# Patient Record
Sex: Female | Born: 1960 | Hispanic: No | Marital: Married | Smoking: Former smoker
Health system: Southern US, Community
[De-identification: ages and names within clinical notes are randomized; demographics above are authoritative.]

## PROBLEM LIST (undated history)

## (undated) DIAGNOSIS — M1990 Unspecified osteoarthritis, unspecified site: Secondary | ICD-10-CM

## (undated) DIAGNOSIS — I499 Cardiac arrhythmia, unspecified: Secondary | ICD-10-CM

## (undated) DIAGNOSIS — I7300 Raynaud's syndrome without gangrene: Secondary | ICD-10-CM

## (undated) HISTORY — PX: CHOLECYSTECTOMY: SHX55

## (undated) HISTORY — PX: HERNIA REPAIR: SHX51

---

## 2002-12-08 ENCOUNTER — Other Ambulatory Visit: Admission: RE | Admit: 2002-12-08 | Discharge: 2002-12-08 | Payer: Self-pay | Admitting: Obstetrics and Gynecology

## 2013-12-10 ENCOUNTER — Other Ambulatory Visit (HOSPITAL_COMMUNITY): Payer: Self-pay | Admitting: Orthopaedic Surgery

## 2013-12-14 ENCOUNTER — Encounter (HOSPITAL_COMMUNITY): Payer: Self-pay

## 2013-12-14 ENCOUNTER — Encounter (HOSPITAL_COMMUNITY): Payer: Self-pay | Admitting: Pharmacy Technician

## 2013-12-14 ENCOUNTER — Encounter (HOSPITAL_COMMUNITY)
Admission: RE | Admit: 2013-12-14 | Discharge: 2013-12-14 | Disposition: A | Discharge status: Home / Self Care | Payer: BC Managed Care – PPO | Source: Ambulatory Visit | Attending: Orthopaedic Surgery | Admitting: Orthopaedic Surgery

## 2013-12-14 DIAGNOSIS — Z01818 Encounter for other preprocedural examination: Secondary | ICD-10-CM | POA: Insufficient documentation

## 2013-12-14 DIAGNOSIS — Z01812 Encounter for preprocedural laboratory examination: Secondary | ICD-10-CM | POA: Insufficient documentation

## 2013-12-14 HISTORY — DX: Unspecified osteoarthritis, unspecified site: M19.90

## 2013-12-14 HISTORY — DX: Raynaud's syndrome without gangrene: I73.00

## 2013-12-14 HISTORY — DX: Cardiac arrhythmia, unspecified: I49.9

## 2013-12-14 LAB — CBC
HEMATOCRIT: 39.6 % (ref 36.0–46.0)
Hemoglobin: 12.4 g/dL (ref 12.0–15.0)
MCH: 25.3 pg — AB (ref 26.0–34.0)
MCHC: 31.3 g/dL (ref 30.0–36.0)
MCV: 80.7 fL (ref 78.0–100.0)
Platelets: 227 10*3/uL (ref 150–400)
RBC: 4.91 MIL/uL (ref 3.87–5.11)
RDW: 15.3 % (ref 11.5–15.5)
WBC: 6.2 10*3/uL (ref 4.0–10.5)

## 2013-12-14 LAB — URINALYSIS, ROUTINE W REFLEX MICROSCOPIC
BILIRUBIN URINE: NEGATIVE
Glucose, UA: NEGATIVE mg/dL
Hgb urine dipstick: NEGATIVE
Ketones, ur: NEGATIVE mg/dL
Leukocytes, UA: NEGATIVE
Nitrite: NEGATIVE
PH: 6 (ref 5.0–8.0)
Protein, ur: NEGATIVE mg/dL
SPECIFIC GRAVITY, URINE: 1.004 — AB (ref 1.005–1.030)
Urobilinogen, UA: 0.2 mg/dL (ref 0.0–1.0)

## 2013-12-14 LAB — BASIC METABOLIC PANEL
Anion gap: 12 (ref 5–15)
BUN: 10 mg/dL (ref 6–23)
CALCIUM: 9.4 mg/dL (ref 8.4–10.5)
CO2: 25 meq/L (ref 19–32)
Chloride: 102 mEq/L (ref 96–112)
Creatinine, Ser: 0.6 mg/dL (ref 0.50–1.10)
GFR calc Af Amer: >90 mL/min (ref >90)
GFR calc non Af Amer: >90 mL/min (ref >90)
GLUCOSE: 92 mg/dL (ref 70–99)
Potassium: 4.3 mEq/L (ref 3.7–5.3)
SODIUM: 139 meq/L (ref 137–147)

## 2013-12-14 LAB — PROTIME-INR
INR: 0.93 (ref 0.00–1.49)
Prothrombin Time: 12.5 seconds (ref 11.6–15.2)

## 2013-12-14 LAB — APTT: APTT: 35 s (ref 24–37)

## 2013-12-14 LAB — SURGICAL PCR SCREEN
MRSA, PCR: NEGATIVE
Staphylococcus aureus: NEGATIVE

## 2013-12-14 LAB — HCG, SERUM, QUALITATIVE: PREG SERUM: NEGATIVE

## 2013-12-14 NOTE — Patient Instructions (Signed)
[name] [name]  12/14/2013   Your procedure is scheduled on:  12/24/2013  [id number]  Report to [facility].  Follow the Signs to Short Stay Center at    0530    am  Call this number if you have problems the morning of surgery: [phone]   Remember:   Do not eat food or drink liquids after midnight.   Take these medicines the morning of surgery with A SIP OF WATER:    Do not wear jewelry, make-up or nail polish.  Do not wear lotions, powders, or perfumes. , deodorant  Do not shave 48 hours prior to surgery.   Do not bring valuables to the hospital.  Contacts, dentures or bridgework may not be worn into surgery.  Leave suitcase in the car. After surgery it may be brought to your room.  For patients admitted to the hospital, checkout time is 11:00 AM the day of  discharge.   [facility] - Preparing for Surgery Before surgery, you can play an important role.  Because skin is not sterile, your skin needs to be as free of germs as possible.  You can reduce the number of germs on your skin by washing with CHG (chlorahexidine gluconate) soap before surgery.  CHG is an antiseptic cleaner which kills germs and bonds with the skin to continue killing germs even after washing. Please DO NOT use if you have an allergy to CHG or antibacterial soaps.  If your skin becomes reddened/irritated stop using the CHG and inform your nurse when you arrive at Short Stay. Do not shave (including legs and underarms) for at least 48 hours prior to the first CHG shower.  You may shave your face/neck. Please follow these instructions carefully:  1.  Shower with CHG Soap the night before surgery and the  morning of Surgery.  2.  If you choose to wash your hair, wash your hair first as usual with your  normal  shampoo.  3.  After you shampoo, rinse your hair and body thoroughly to remove the  shampoo.                           4.  Use CHG as you would any other liquid soap.  You can apply chg  directly  to the skin and wash                       Gently with a scrungie or clean washcloth.  5.  Apply the CHG Soap to your body ONLY FROM THE NECK DOWN.   Do not use on face/ open                           Wound or open sores. Avoid contact with eyes, ears mouth and genitals (private parts).                       Wash face,  Genitals (private parts) with your normal soap.             6.  Wash thoroughly, paying special attention to the area where your surgery  will be performed.  7.  Thoroughly rinse your body with warm water from the neck down.  8.  DO NOT shower/wash with your normal soap after using and rinsing off  the CHG Soap.  9.  Pat yourself dry with a clean towel.            10.  Wear clean pajamas.            11.  Place clean sheets on your bed the night of your first shower and do not  sleep with pets. Day of Surgery : Do not apply any lotions/deodorants the morning of surgery.  Please wear clean clothes to the hospital/surgery center.  FAILURE TO FOLLOW THESE INSTRUCTIONS MAY RESULT IN THE CANCELLATION OF YOUR SURGERY PATIENT SIGNATURE_________________________________  NURSE SIGNATURE__________________________________  ________________________________________________________________________  WHAT IS A BLOOD TRANSFUSION? Blood Transfusion Information  A transfusion is the replacement of blood or some of its parts. Blood is made up of multiple cells which provide different functions.  Red blood cells carry oxygen and are used for blood loss replacement.  White blood cells fight against infection.  Platelets control bleeding.  Plasma helps clot blood.  Other blood products are available for specialized needs, such as hemophilia or other clotting disorders. BEFORE THE TRANSFUSION  Who gives blood for transfusions?   Healthy volunteers who are fully evaluated to make sure their blood is safe. This is blood bank blood. Transfusion therapy is the safest  it has ever been in the practice of medicine. Before blood is taken from a donor, a complete history is taken to make sure that person has no history of diseases nor engages in risky social behavior (examples are intravenous drug use or sexual activity with multiple partners). The donor's travel history is screened to minimize risk of transmitting infections, such as malaria. The donated blood is tested for signs of infectious diseases, such as HIV and hepatitis. The blood is then tested to be sure it is compatible with you in order to minimize the chance of a transfusion reaction. If you or a relative donates blood, this is often done in anticipation of surgery and is not appropriate for emergency situations. It takes many days to process the donated blood. RISKS AND COMPLICATIONS Although transfusion therapy is very safe and saves many lives, the main dangers of transfusion include:   Getting an infectious disease.  Developing a transfusion reaction. This is an allergic reaction to something in the blood you were given. Every precaution is taken to prevent this. The decision to have a blood transfusion has been considered carefully by your caregiver before blood is given. Blood is not given unless the benefits outweigh the risks. AFTER THE TRANSFUSION  Right after receiving a blood transfusion, you will usually feel much better and more energetic. This is especially true if your red blood cells have gotten low (anemic). The transfusion raises the level of the red blood cells which carry oxygen, and this usually causes an energy increase.  The nurse administering the transfusion will monitor you carefully for complications. HOME CARE INSTRUCTIONS  No special instructions are needed after a transfusion. You may find your energy is better. Speak with your caregiver about any limitations on activity for underlying diseases you may have. SEEK MEDICAL CARE IF:   Your condition is not improving after  your transfusion.  You develop redness or irritation at the intravenous (IV) site. SEEK IMMEDIATE MEDICAL CARE IF:  Any of the following symptoms occur over the next 12 hours:  Shaking chills.  You have a temperature by mouth above 102 F (38.9 C), not controlled by medicine.  Chest, back, or muscle pain.  People around you feel you are not acting correctly or  are confused.  Shortness of breath or difficulty breathing.  Dizziness and fainting.  You get a rash or develop hives.  You have a decrease in urine output.  Your urine turns a dark color or changes to pink, red, or brown. Any of the following symptoms occur over the next 10 days:  You have a temperature by mouth above 102 F (38.9 C), not controlled by medicine.  Shortness of breath.  Weakness after normal activity.  The white part of the eye turns yellow (jaundice).  You have a decrease in the amount of urine or are urinating less often.  Your urine turns a dark color or changes to pink, red, or brown. Document Released: 05/04/2000 Document Revised: 07/30/2011 Document Reviewed: 12/22/2007 ExitCare Patient Information 2014 [place].  _______________________________________________________________________  Incentive Spirometer  An incentive spirometer is a tool that can help keep your lungs clear and active. This tool measures how well you are filling your lungs with each breath. Taking long deep breaths may help reverse or decrease the chance of developing breathing (pulmonary) problems (especially infection) following:  A long period of time when you are unable to move or be active. BEFORE THE PROCEDURE   If the spirometer includes an indicator to show your best effort, your nurse or respiratory therapist will set it to a desired goal.  If possible, sit up straight or lean slightly forward. Try not to slouch.  Hold the incentive spirometer in an upright position. INSTRUCTIONS FOR USE  1. Sit on  the edge of your bed if possible, or sit up as far as you can in bed or on a chair. 2. Hold the incentive spirometer in an upright position. 3. Breathe out normally. 4. Place the mouthpiece in your mouth and seal your lips tightly around it. 5. Breathe in slowly and as deeply as possible, raising the piston or the ball toward the top of the column. 6. Hold your breath for 3-5 seconds or for as long as possible. Allow the piston or ball to fall to the bottom of the column. 7. Remove the mouthpiece from your mouth and breathe out normally. 8. Rest for a few seconds and repeat Steps 1 through 7 at least 10 times every 1-2 hours when you are awake. Take your time and take a few normal breaths between deep breaths. 9. The spirometer may include an indicator to show your best effort. Use the indicator as a goal to work toward during each repetition. 10. After each set of 10 deep breaths, practice coughing to be sure your lungs are clear. If you have an incision (the cut made at the time of surgery), support your incision when coughing by placing a pillow or rolled up towels firmly against it. Once you are able to get out of bed, walk around indoors and cough well. You may stop using the incentive spirometer when instructed by your caregiver.  RISKS AND COMPLICATIONS  Take your time so you do not get dizzy or light-headed.  If you are in pain, you may need to take or ask for pain medication before doing incentive spirometry. It is harder to take a deep breath if you are having pain. AFTER USE  Rest and breathe slowly and easily.  It can be helpful to keep track of a log of your progress. Your caregiver can provide you with a simple table to help with this. If you are using the spirometer at home, follow these instructions: [place] IF:  You are having difficultly using the spirometer.  You have trouble using the spirometer as often as instructed.  Your pain medication is not giving  enough relief while using the spirometer.  You develop fever of 100.5 F (38.1 C) or higher. SEEK IMMEDIATE MEDICAL CARE IF:   You cough up bloody sputum that had not been present before.  You develop fever of 102 F (38.9 C) or greater.  You develop worsening pain at or near the incision site. MAKE SURE YOU:   Understand these instructions.  Will watch your condition.  Will get help right away if you are not doing well or get worse. Document Released: 09/17/2006 Document Revised: 07/30/2011 Document Reviewed: 11/18/2006 ExitCare Patient Information 2014 [employer], [place].   ________________________________________________________________________     Please read over the following fact sheets that you were given: MRSA Information, coughing and deep breathing exercises, leg exercises

## 2013-12-16 ENCOUNTER — Encounter (HOSPITAL_COMMUNITY): Payer: Self-pay

## 2013-12-24 ENCOUNTER — Inpatient Hospital Stay (HOSPITAL_COMMUNITY): Payer: BC Managed Care – PPO | Admitting: Certified Registered Nurse Anesthetist

## 2013-12-24 ENCOUNTER — Inpatient Hospital Stay (HOSPITAL_COMMUNITY): Payer: BC Managed Care – PPO

## 2013-12-24 ENCOUNTER — Inpatient Hospital Stay (HOSPITAL_COMMUNITY)
Admission: RE | Admit: 2013-12-24 | Discharge: 2013-12-27 | DRG: 470 | Disposition: A | Discharge status: Home Health | Payer: BC Managed Care – PPO | Source: Ambulatory Visit | Attending: Orthopaedic Surgery | Admitting: Orthopaedic Surgery

## 2013-12-24 ENCOUNTER — Encounter (HOSPITAL_COMMUNITY): Payer: BC Managed Care – PPO | Admitting: Certified Registered Nurse Anesthetist

## 2013-12-24 ENCOUNTER — Encounter (HOSPITAL_COMMUNITY): Payer: Self-pay | Admitting: *Deleted

## 2013-12-24 ENCOUNTER — Encounter (HOSPITAL_COMMUNITY)
Admission: RE | Disposition: A | Discharge status: Home Health | Payer: Self-pay | Source: Ambulatory Visit | Attending: Orthopaedic Surgery

## 2013-12-24 DIAGNOSIS — Z9089 Acquired absence of other organs: Secondary | ICD-10-CM

## 2013-12-24 DIAGNOSIS — M1611 Unilateral primary osteoarthritis, right hip: Secondary | ICD-10-CM

## 2013-12-24 DIAGNOSIS — Z01812 Encounter for preprocedural laboratory examination: Secondary | ICD-10-CM

## 2013-12-24 DIAGNOSIS — F17200 Nicotine dependence, unspecified, uncomplicated: Secondary | ICD-10-CM | POA: Diagnosis present

## 2013-12-24 DIAGNOSIS — Z6829 Body mass index (BMI) 29.0-29.9, adult: Secondary | ICD-10-CM

## 2013-12-24 DIAGNOSIS — Z96641 Presence of right artificial hip joint: Secondary | ICD-10-CM

## 2013-12-24 DIAGNOSIS — M1610 Unilateral primary osteoarthritis, unspecified hip: Principal | ICD-10-CM | POA: Diagnosis present

## 2013-12-24 DIAGNOSIS — I7300 Raynaud's syndrome without gangrene: Secondary | ICD-10-CM | POA: Diagnosis present

## 2013-12-24 DIAGNOSIS — M169 Osteoarthritis of hip, unspecified: Principal | ICD-10-CM | POA: Diagnosis present

## 2013-12-24 DIAGNOSIS — D62 Acute posthemorrhagic anemia: Secondary | ICD-10-CM | POA: Diagnosis not present

## 2013-12-24 HISTORY — PX: TOTAL HIP ARTHROPLASTY: SHX124

## 2013-12-24 LAB — TYPE AND SCREEN
ABO/RH(D): O POS
ANTIBODY SCREEN: NEGATIVE

## 2013-12-24 LAB — ABO/RH: ABO/RH(D): O POS

## 2013-12-24 SURGERY — ARTHROPLASTY, HIP, TOTAL, ANTERIOR APPROACH
Anesthesia: Spinal | Site: Hip | Laterality: Right

## 2013-12-24 MED ORDER — KETAMINE HCL 10 MG/ML IJ SOLN
INTRAMUSCULAR | Status: DC | PRN
  Administered 2013-12-24 (×3): 20 mg via INTRAVENOUS

## 2013-12-24 MED ORDER — ONDANSETRON HCL 4 MG PO TABS
4.0000 mg | ORAL_TABLET | Freq: Four times a day (QID) | ORAL | Status: DC | PRN
Start: 2013-12-24 — End: 2013-12-27

## 2013-12-24 MED ORDER — ONDANSETRON HCL 4 MG/2ML IJ SOLN: 4.0000 mg | Freq: Four times a day (QID) | INTRAMUSCULAR | Status: DC | PRN

## 2013-12-24 MED ORDER — DIPHENHYDRAMINE HCL 12.5 MG/5ML PO ELIX: 12.5000 mg | ORAL_SOLUTION | ORAL | Status: DC | PRN

## 2013-12-24 MED ORDER — PROPOFOL INFUSION 10 MG/ML OPTIME
INTRAVENOUS | Status: DC | PRN
  Administered 2013-12-24: 50 ug/kg/min via INTRAVENOUS

## 2013-12-24 MED ORDER — METHOCARBAMOL 1000 MG/10ML IJ SOLN
500.0000 mg | Freq: Four times a day (QID) | INTRAMUSCULAR | Status: DC | PRN
  Administered 2013-12-24: 500 mg via INTRAVENOUS
  Filled 2013-12-24: qty 5

## 2013-12-24 MED ORDER — SODIUM CHLORIDE 0.9 % IR SOLN
Status: DC | PRN
  Administered 2013-12-24: 1000 mL

## 2013-12-24 MED ORDER — ZOLPIDEM TARTRATE 5 MG PO TABS
5.0000 mg | ORAL_TABLET | Freq: Every evening | ORAL | Status: DC | PRN
Start: 2013-12-24 — End: 2013-12-27

## 2013-12-24 MED ORDER — ACETAMINOPHEN 10 MG/ML IV SOLN
1000.0000 mg | Freq: Once | INTRAVENOUS | Status: AC
  Administered 2013-12-24: 1000 mg via INTRAVENOUS
  Filled 2013-12-24: qty 100

## 2013-12-24 MED ORDER — SODIUM CHLORIDE 0.9 % IV SOLN
1000.0000 mg | INTRAVENOUS | Status: AC
  Administered 2013-12-24: 1000 mg via INTRAVENOUS
  Filled 2013-12-24: qty 10

## 2013-12-24 MED ORDER — FENTANYL CITRATE 0.05 MG/ML IJ SOLN
INTRAMUSCULAR | Status: DC | PRN
  Administered 2013-12-24 (×2): 50 ug via INTRAVENOUS

## 2013-12-24 MED ORDER — CEFAZOLIN SODIUM 1-5 GM-% IV SOLN
1.0000 g | Freq: Four times a day (QID) | INTRAVENOUS | Status: AC
  Administered 2013-12-24 (×2): 1 g via INTRAVENOUS
  Filled 2013-12-24 (×2): qty 50

## 2013-12-24 MED ORDER — ONDANSETRON HCL 4 MG/2ML IJ SOLN
INTRAMUSCULAR | Status: DC | PRN
  Administered 2013-12-24: 4 mg via INTRAVENOUS

## 2013-12-24 MED ORDER — METOCLOPRAMIDE HCL 5 MG/ML IJ SOLN: 5.0000 mg | Freq: Three times a day (TID) | INTRAMUSCULAR | Status: DC | PRN

## 2013-12-24 MED ORDER — CEFAZOLIN SODIUM-DEXTROSE 2-3 GM-% IV SOLR
2.0000 g | INTRAVENOUS | Status: AC
  Administered 2013-12-24: 2 g via INTRAVENOUS

## 2013-12-24 MED ORDER — DOCUSATE SODIUM 100 MG PO CAPS
100.0000 mg | ORAL_CAPSULE | Freq: Two times a day (BID) | ORAL | Status: DC
  Administered 2013-12-24 – 2013-12-27 (×6): 100 mg via ORAL

## 2013-12-24 MED ORDER — DEXAMETHASONE SODIUM PHOSPHATE 10 MG/ML IJ SOLN
INTRAMUSCULAR | Status: DC | PRN
  Administered 2013-12-24: 10 mg via INTRAVENOUS

## 2013-12-24 MED ORDER — 0.9 % SODIUM CHLORIDE (POUR BTL) OPTIME
TOPICAL | Status: DC | PRN
  Administered 2013-12-24: 1000 mL

## 2013-12-24 MED ORDER — MIDAZOLAM HCL 5 MG/5ML IJ SOLN
INTRAMUSCULAR | Status: DC | PRN
  Administered 2013-12-24: 2 mg via INTRAVENOUS

## 2013-12-24 MED ORDER — LACTATED RINGERS IV SOLN: INTRAVENOUS | Status: DC

## 2013-12-24 MED ORDER — ACETAMINOPHEN 325 MG PO TABS: 650.0000 mg | ORAL_TABLET | Freq: Four times a day (QID) | ORAL | Status: DC | PRN

## 2013-12-24 MED ORDER — ASPIRIN EC 325 MG PO TBEC
325.0000 mg | DELAYED_RELEASE_TABLET | Freq: Two times a day (BID) | ORAL | Status: DC
  Administered 2013-12-24 – 2013-12-27 (×6): 325 mg via ORAL
  Filled 2013-12-24 (×9): qty 1

## 2013-12-24 MED ORDER — OXYCODONE HCL 5 MG PO TABS
5.0000 mg | ORAL_TABLET | ORAL | Status: DC | PRN
  Administered 2013-12-24 (×2): 5 mg via ORAL
  Administered 2013-12-24 – 2013-12-27 (×19): 10 mg via ORAL
  Filled 2013-12-24 (×14): qty 2
  Filled 2013-12-24: qty 1
  Filled 2013-12-24 (×5): qty 2
  Filled 2013-12-24: qty 1

## 2013-12-24 MED ORDER — HYDROMORPHONE HCL PF 1 MG/ML IJ SOLN
0.2500 mg | INTRAMUSCULAR | Status: DC | PRN
Start: 2013-12-24 — End: 2013-12-24

## 2013-12-24 MED ORDER — ALUM & MAG HYDROXIDE-SIMETH 200-200-20 MG/5ML PO SUSP
30.0000 mL | ORAL | Status: DC | PRN
Start: 2013-12-24 — End: 2013-12-27

## 2013-12-24 MED ORDER — METHOCARBAMOL 500 MG PO TABS
500.0000 mg | ORAL_TABLET | Freq: Four times a day (QID) | ORAL | Status: DC | PRN
  Administered 2013-12-25 – 2013-12-27 (×8): 500 mg via ORAL
  Filled 2013-12-24 (×9): qty 1

## 2013-12-24 MED ORDER — METOCLOPRAMIDE HCL 10 MG PO TABS: 5.0000 mg | ORAL_TABLET | Freq: Three times a day (TID) | ORAL | Status: DC | PRN

## 2013-12-24 MED ORDER — MENTHOL 3 MG MT LOZG
1.0000 | LOZENGE | OROMUCOSAL | Status: DC | PRN
Start: 2013-12-24 — End: 2013-12-27
  Filled 2013-12-24: qty 9

## 2013-12-24 MED ORDER — PROPOFOL 10 MG/ML IV BOLUS
INTRAVENOUS | Status: DC | PRN
  Administered 2013-12-24 (×7): 20 mg via INTRAVENOUS

## 2013-12-24 MED ORDER — POLYETHYLENE GLYCOL 3350 17 G PO PACK
17.0000 g | PACK | Freq: Every day | ORAL | Status: DC | PRN
  Administered 2013-12-26 – 2013-12-27 (×2): 17 g via ORAL

## 2013-12-24 MED ORDER — PHENOL 1.4 % MT LIQD
1.0000 | OROMUCOSAL | Status: DC | PRN
Start: 2013-12-24 — End: 2013-12-27
  Filled 2013-12-24: qty 177

## 2013-12-24 MED ORDER — PROMETHAZINE HCL 25 MG/ML IJ SOLN: 6.2500 mg | INTRAMUSCULAR | Status: DC | PRN

## 2013-12-24 MED ORDER — SODIUM CHLORIDE 0.9 % IV SOLN
INTRAVENOUS | Status: DC
  Administered 2013-12-24: 12:00:00 via INTRAVENOUS
  Administered 2013-12-25: 75 mL/h via INTRAVENOUS
  Administered 2013-12-25: 08:00:00 via INTRAVENOUS

## 2013-12-24 MED ORDER — ACETAMINOPHEN 650 MG RE SUPP: 650.0000 mg | Freq: Four times a day (QID) | RECTAL | Status: DC | PRN

## 2013-12-24 MED ORDER — STERILE WATER FOR IRRIGATION IR SOLN
Status: DC | PRN
  Administered 2013-12-24: 1500 mL

## 2013-12-24 MED ORDER — LACTATED RINGERS IV SOLN
INTRAVENOUS | Status: DC | PRN
  Administered 2013-12-24 (×3): via INTRAVENOUS

## 2013-12-24 MED ORDER — HYDROMORPHONE HCL PF 1 MG/ML IJ SOLN
1.0000 mg | INTRAMUSCULAR | Status: DC | PRN
  Administered 2013-12-24 – 2013-12-25 (×3): 1 mg via INTRAVENOUS
  Filled 2013-12-24 (×4): qty 1

## 2013-12-24 SURGICAL SUPPLY — 48 items
BAG ZIPLOCK 12X15 (MISCELLANEOUS) ×3 IMPLANT
BENZOIN TINCTURE PRP APPL 2/3 (GAUZE/BANDAGES/DRESSINGS) ×3 IMPLANT
BLADE SAW SGTL 18X1.27X75 (BLADE) ×2 IMPLANT
BLADE SAW SGTL 18X1.27X75MM (BLADE) ×1
CAPT HIP PF COP ×3 IMPLANT
CELLS DAT CNTRL 66122 CELL SVR (MISCELLANEOUS) ×1 IMPLANT
CLOSURE WOUND 1/2 X4 (GAUZE/BANDAGES/DRESSINGS) ×1
COVER PERINEAL POST (MISCELLANEOUS) ×3 IMPLANT
DRAPE C-ARM 42X120 X-RAY (DRAPES) ×3 IMPLANT
DRAPE STERI IOBAN 125X83 (DRAPES) ×3 IMPLANT
DRAPE U-SHAPE 47X51 STRL (DRAPES) ×9 IMPLANT
DRSG AQUACEL AG ADV 3.5X10 (GAUZE/BANDAGES/DRESSINGS) ×3 IMPLANT
DURAPREP 26ML APPLICATOR (WOUND CARE) ×3 IMPLANT
ELECT BLADE TIP CTD 4 INCH (ELECTRODE) ×3 IMPLANT
ELECT REM PT RETURN 9FT ADLT (ELECTROSURGICAL) ×3
ELECTRODE REM PT RTRN 9FT ADLT (ELECTROSURGICAL) ×1 IMPLANT
FACESHIELD WRAPAROUND (MASK) ×12 IMPLANT
GAUZE XEROFORM 1X8 LF (GAUZE/BANDAGES/DRESSINGS) ×3 IMPLANT
GLOVE BIO SURGEON STRL SZ7.5 (GLOVE) ×3 IMPLANT
GLOVE BIOGEL PI IND STRL 6.5 (GLOVE) ×1 IMPLANT
GLOVE BIOGEL PI IND STRL 7.0 (GLOVE) ×1 IMPLANT
GLOVE BIOGEL PI IND STRL 7.5 (GLOVE) ×1 IMPLANT
GLOVE BIOGEL PI IND STRL 8 (GLOVE) ×2 IMPLANT
GLOVE BIOGEL PI INDICATOR 6.5 (GLOVE) ×2
GLOVE BIOGEL PI INDICATOR 7.0 (GLOVE) ×2
GLOVE BIOGEL PI INDICATOR 7.5 (GLOVE) ×2
GLOVE BIOGEL PI INDICATOR 8 (GLOVE) ×4
GLOVE ECLIPSE 8.0 STRL XLNG CF (GLOVE) ×3 IMPLANT
GLOVE SURG SS PI 7.0 STRL IVOR (GLOVE) ×3 IMPLANT
GLOVE SURG SS PI 7.5 STRL IVOR (GLOVE) ×3 IMPLANT
GOWN BRE IMP PREV XXLGXLNG (GOWN DISPOSABLE) ×3 IMPLANT
GOWN STRL REUS W/TWL XL LVL3 (GOWN DISPOSABLE) ×9 IMPLANT
HANDPIECE INTERPULSE COAX TIP (DISPOSABLE) ×2
HOLDER FOLEY CATH W/STRAP (MISCELLANEOUS) ×3 IMPLANT
KIT BASIN OR (CUSTOM PROCEDURE TRAY) ×3 IMPLANT
PACK TOTAL JOINT (CUSTOM PROCEDURE TRAY) ×3 IMPLANT
RTRCTR WOUND ALEXIS 18CM MED (MISCELLANEOUS) ×3
SET HNDPC FAN SPRY TIP SCT (DISPOSABLE) ×1 IMPLANT
STRIP CLOSURE SKIN 1/2X4 (GAUZE/BANDAGES/DRESSINGS) ×2 IMPLANT
SUT ETHIBOND NAB CT1 #1 30IN (SUTURE) ×3 IMPLANT
SUT MNCRL AB 4-0 PS2 18 (SUTURE) ×3 IMPLANT
SUT VIC AB 0 CT1 36 (SUTURE) ×3 IMPLANT
SUT VIC AB 1 CT1 36 (SUTURE) ×3 IMPLANT
SUT VIC AB 2-0 CT1 27 (SUTURE) ×4
SUT VIC AB 2-0 CT1 TAPERPNT 27 (SUTURE) ×2 IMPLANT
TOWEL OR 17X26 10 PK STRL BLUE (TOWEL DISPOSABLE) ×3 IMPLANT
TOWEL OR NON WOVEN STRL DISP B (DISPOSABLE) ×3 IMPLANT
TRAY FOLEY CATH 14FRSI W/METER (CATHETERS) ×3 IMPLANT

## 2013-12-24 NOTE — Evaluation (Signed)
Physical Therapy Evaluation Patient Details Name: [name] [name] MRN: [id number] DOB: 01-23-1961 Today's Date: 12/24/2013   History of Present Illness  R THR - ant dir  Clinical Impression  Pt s/p R THR presents with decreased R LE strength/ROM and post op pain limiting functional mobility.  Pt should progress well to d/c home with family assist and HHPT follow up.    Follow Up Recommendations Home health PT    Equipment Recommendations  Rolling walker with 5" wheels    Recommendations for Other Services OT consult     Precautions / Restrictions Precautions Precautions: Fall Restrictions Weight Bearing Restrictions: No Other Position/Activity Restrictions: WBAT      Mobility  Bed Mobility Overal bed mobility: Needs Assistance Bed Mobility: Supine to Sit     Supine to sit: Mod assist     General bed mobility comments: cues for sequence and use of L LE to self assist  Transfers Overall transfer level: Needs assistance Equipment used: Rolling walker (2 wheeled) Transfers: Sit to/from Stand Sit to Stand: Min assist         General transfer comment: cues for LE management and use of UEs to self assist  Ambulation/Gait Ambulation/Gait assistance: Min assist Ambulation Distance (Feet): 48 Feet Assistive device: Rolling walker (2 wheeled) Gait Pattern/deviations: Step-to pattern Gait velocity: decr   General Gait Details: cues for posture, sequence and position from [employer]            Wheelchair Mobility    Modified Rankin (Stroke Patients Only)       Balance                                             Pertinent Vitals/Pain Pain Assessment: 0-10 Pain Score: 6  Pain Location: R hip and thigh Pain Descriptors / Indicators: Aching;Burning Pain Intervention(s): Limited activity within patient's tolerance;Ice applied;Premedicated before session    Home Living Family/patient expects to be discharged to:: Private  residence Living Arrangements: Spouse/significant other Available Help at Discharge: Family Type of Home: House Home Access: Stairs to enter Entrance Stairs-Rails: None Entrance Stairs-Number of Steps: 4 Home Layout: One level Home Equipment: Walker - standard;Bedside commode Additional Comments: Pt plans to borrow SW    Prior Function Level of Independence: Independent               Hand Dominance   Dominant Hand: Right    Extremity/Trunk Assessment   Upper Extremity Assessment: Overall WFL for tasks assessed           Lower Extremity Assessment: RLE deficits/detail RLE Deficits / Details: Hip strength 2+/5 with AAROM at hip to 85 flex and 20 abd    Cervical / Trunk Assessment: Normal  Communication   Communication: No difficulties  Cognition Arousal/Alertness: Awake/alert Behavior During Therapy: WFL for tasks assessed/performed Overall Cognitive Status: Within Functional Limits for tasks assessed                      General Comments      Exercises Total Joint Exercises Ankle Circles/Pumps: AROM;Both;10 reps;Supine Quad Sets: AROM;Both;10 reps;Supine Heel Slides: AAROM;Right;15 reps;Supine Hip ABduction/ADduction: AAROM;Right;10 reps;Supine      Assessment/Plan    PT Assessment Patient needs continued PT services  PT Diagnosis Difficulty walking   PT Problem List Decreased strength;Decreased range of motion;Decreased activity tolerance;Decreased mobility;Decreased knowledge of use of DME;Pain  PT Treatment Interventions DME instruction;Gait training;Stair training;Functional mobility training;Therapeutic activities;Therapeutic exercise;Patient/family education   PT Goals (Current goals can be found in the Care Plan section) Acute Rehab PT Goals Patient Stated Goal: Resume previous lifestyle with decreased pain PT Goal Formulation: With patient Time For Goal Achievement: 12/31/13 Potential to Achieve Goals: Good    Frequency 7X/week    Barriers to discharge        Co-evaluation               End of Session Equipment Utilized During Treatment: Gait belt Activity Tolerance: Patient tolerated treatment well Patient left: in chair;with call bell/phone within reach;with family/visitor present Nurse Communication: Mobility status         Time: 1450-1518 PT Time Calculation (min): 28 min   Charges:   PT Evaluation $Initial PT Evaluation Tier I: 1 Procedure PT Treatments $Gait Training: 8-22 mins $Therapeutic Exercise: 8-22 mins   PT G Codes:          Oziah Vitanza 12/24/2013, 3:57 PM

## 2013-12-24 NOTE — Anesthesia Preprocedure Evaluation (Addendum)
Anesthesia Evaluation  Patient identified by MRN, date of birth, ID band Patient awake    Reviewed: Allergy & Precautions, H&P , NPO status , Patient's Chart, lab work & pertinent test results  Airway Mallampati: I TM Distance: >3 FB Neck ROM: Full    Dental  (+) Teeth Intact, Dental Advisory Given   Pulmonary Current Smoker,  breath sounds clear to auscultation        Cardiovascular + Peripheral Vascular Disease (Raynauds disease) Rhythm:Regular Rate:Normal     Neuro/Psych negative neurological ROS  negative psych ROS   GI/Hepatic negative GI ROS, Neg liver ROS,   Endo/Other  negative endocrine ROS  Renal/GU negative Renal ROS  negative genitourinary   Musculoskeletal negative musculoskeletal ROS (+)   Abdominal   Peds  Hematology negative hematology ROS (+)   Anesthesia Other Findings   Reproductive/Obstetrics negative OB ROS                          Anesthesia Physical Anesthesia Plan  ASA: II  Anesthesia Plan: Spinal   Post-op Pain Management:    Induction: Intravenous  Airway Management Planned: Simple Face Mask  Additional Equipment: None  Intra-op Plan:   Post-operative Plan:   Informed Consent: I have reviewed the patients History and Physical, chart, labs and discussed the procedure including the risks, benefits and alternatives for the proposed anesthesia with the patient or authorized representative who has indicated his/her understanding and acceptance.   Dental advisory given  Plan Discussed with: CRNA and Surgeon  Anesthesia Plan Comments:        Anesthesia Quick Evaluation

## 2013-12-24 NOTE — Anesthesia Postprocedure Evaluation (Signed)
  Anesthesia Post-op Note  Patient: [name] [name]  Procedure(s) Performed: Procedure(s): RIGHT TOTAL HIP ARTHROPLASTY ANTERIOR APPROACH (Right)  Patient Location: PACU  Anesthesia Type:Spinal  Level of Consciousness: awake, alert  and oriented  Airway and Oxygen Therapy: Patient Spontanous Breathing  Post-op Pain: none  Post-op Assessment: Post-op Vital signs reviewed. Spinal resolving and pt able to adduct both legs and activate hamstring muscles.   Post-op Vital Signs: Reviewed  Last Vitals:  Filed Vitals:   12/24/13 1015  BP: 114/39  Pulse: 63  Temp:   Resp: 13    Complications: No apparent anesthesia complications

## 2013-12-24 NOTE — Anesthesia Procedure Notes (Addendum)
Spinal  Patient location during procedure: OR Start time: 12/24/2013 7:30 AM Staffing Anesthesiologist: Marcene DuosFITZGERALD, Wilba Mutz E Performed by: anesthesiologist  Preanesthetic Checklist Completed: patient identified, site marked, surgical consent, pre-op evaluation, timeout performed, IV checked, risks and benefits discussed and monitors and equipment checked Spinal Block Patient position: sitting Prep: Betadine and site prepped and draped Patient monitoring: heart rate, cardiac monitor, continuous pulse ox and blood pressure Approach: midline Location: L2-3 Injection technique: single-shot Needle Needle type: Spinocan  Needle gauge: 22 G Needle length: 9 cm Additional Notes Prepped and draped in sterile fashion. CSF aspirated before and after injection 3cc's 0.5% Bupivicaine plain.

## 2013-12-24 NOTE — Brief Op Note (Signed)
12/24/2013  9:06 AM  PATIENT:  [name] [name]  53 y.o. female  PRE-OPERATIVE DIAGNOSIS:  Right hip osteoarthritis  POST-OPERATIVE DIAGNOSIS:  Right hip osteoarthritis  PROCEDURE:  Procedure(s): RIGHT TOTAL HIP ARTHROPLASTY ANTERIOR APPROACH (Right)  SURGEON:  Surgeon(s) and Role:    * Kathryne Hitchhristopher Y Essie Gehret, MD - Primary  PHYSICIAN ASSISTANT: Rexene EdisonGil Clark, PA-C  ANESTHESIA:   spinal  EBL:  Total I/O In: 1000 [I.V.:1000] Out: 600 [Urine:150; Blood:450]  BLOOD ADMINISTERED:none  DRAINS: none   LOCAL MEDICATIONS USED:  NONE  SPECIMEN:  No Specimen  DISPOSITION OF SPECIMEN:  N/A  COUNTS:  YES  TOURNIQUET:  * No tourniquets in log *  DICTATION: .Other Dictation: Dictation Number [id number]  PLAN OF CARE: Admit to inpatient   PATIENT DISPOSITION:  PACU - hemodynamically stable.   Delay start of Pharmacological VTE agent (>24hrs) due to surgical blood loss or risk of bleeding: no

## 2013-12-24 NOTE — H&P (Signed)
TOTAL HIP ADMISSION H&P  Patient is admitted for right total hip arthroplasty.  Subjective:  Chief Complaint: right hip pain  HPI: [name] [name], 53 y.o. female, has a history of pain and functional disability in the right hip(s) due to arthritis and patient has failed non-surgical conservative treatments for greater than 12 weeks to include NSAID's and/or analgesics, corticosteriod injections, supervised PT with diminished ADL's post treatment and activity modification.  Onset of symptoms was gradual starting 2 years ago with gradually worsening course since that time.The patient noted prior procedures of the hip to include arthroscopy on the right hip(s).  Patient currently rates pain in the right hip at 10 out of 10 with activity. Patient has worsening of pain with activity and weight bearing, pain that interfers with activities of daily living and pain with passive range of motion. Patient has evidence of periarticular osteophytes and joint space narrowing by imaging studies. This condition presents safety issues increasing the risk of falls.  There is no current active infection.  Patient Active Problem List   Diagnosis Date Noted  . Arthritis of right hip 12/24/2013   Past Medical History  Diagnosis Date  . Raynaud disease   . Arthritis   . Dysrhythmia     palpitations per patient     Past Surgical History  Procedure Laterality Date  . Cholecystectomy    . Hernia repair      right inguinal hernia repair     Prescriptions prior to admission  Medication Sig Dispense Refill  . naproxen sodium (ANAPROX) 220 MG tablet Take 440 mg by mouth 2 (two) times daily as needed (Pain).       No Known Allergies  History  Substance Use Topics  . Smoking status: Current Every Day Smoker -- 0.25 packs/day for 25 years  . Smokeless tobacco: Never Used  . Alcohol Use: Not on file     Comment: social     History reviewed. No pertinent family history.   Review of Systems   Musculoskeletal: Positive for joint pain.  All other systems reviewed and are negative.   Objective:  Physical Exam  Constitutional: She is oriented to person, place, and time. She appears well-developed and well-nourished.  HENT:  Head: Normocephalic and atraumatic.  Eyes: EOM are normal. Pupils are equal, round, and reactive to light.  Neck: Normal range of motion. Neck supple.  Cardiovascular: Normal rate and regular rhythm.   Respiratory: Effort normal and breath sounds normal.  GI: Soft. Bowel sounds are normal.  Musculoskeletal:       Right hip: She exhibits decreased range of motion, decreased strength and bony tenderness.  Neurological: She is alert and oriented to person, place, and time.  Skin: Skin is warm and dry.  Psychiatric: She has a normal mood and affect.    Vital signs in last 24 hours: Temp:  [98.2 F (36.8 C)] 98.2 F (36.8 C) (08/06 0537) Pulse Rate:  [87] 87 (08/06 0537) Resp:  [18] 18 (08/06 0537) BP: (125)/(80) 125/80 mmHg (08/06 0537) SpO2:  [100 %] 100 % (08/06 0537) Weight:  [75.297 kg (166 lb)] 75.297 kg (166 lb) (08/06 [id number])  Labs:   Estimated body mass index is 29.41 kg/(m^2) as calculated from the following:   Height as of this encounter: 5\' 3"  (1.6 m).   Weight as of this encounter: 75.297 kg (166 lb).   Imaging Review Plain radiographs demonstrate moderate degenerative joint disease of the right hip(s). The bone quality appears to be excellent  for age and reported activity level.  Assessment/Plan:  End stage arthritis, right hip(s)  The patient history, physical examination, clinical judgement of the provider and imaging studies are consistent with end stage degenerative joint disease of the right hip(s) and total hip arthroplasty is deemed medically necessary. The treatment options including medical management, injection therapy, arthroscopy and arthroplasty were discussed at length. The risks and benefits of total hip arthroplasty  were presented and reviewed. The risks due to aseptic loosening, infection, stiffness, dislocation/subluxation,  thromboembolic complications and other imponderables were discussed.  The patient acknowledged the explanation, agreed to proceed with the plan and consent was signed. Patient is being admitted for inpatient treatment for surgery, pain control, PT, OT, prophylactic antibiotics, VTE prophylaxis, progressive ambulation and ADL's and discharge planning.The patient is planning to be discharged home with home health services

## 2013-12-24 NOTE — Op Note (Signed)
NAMEllard Artis:  [name], [name]           ACCOUNT NO.:  [id number]  MEDICAL RECORD NO.:  [id number]  LOCATION:  [facility]                         [facility]  PHYSICIAN:  Vanita PandaChristopher Y. 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  PROCEDURE:  Right total hip arthroplasty through the direct anterior approach.  IMPLANTS:  DePuy Sector Gription acetabular component size 32, apex hole eliminator guide and a single screw, size 32+ 4 neutral polyethylene liner sock, size A Corail femoral component with standard offset, size 32+ 1 ceramic hip ball.  SURGEON:  Doneen Poissonhristopher Nikolos Billig, MD.  ASSISTING:  Richardean CanalGilbert Clark, PA-C.  ANESTHESIA:  Spinal.  ANTIBIOTICS:  2 g IV Ancef.  BLOOD LOSS:  Less than 500 mL.  COMPLICATIONS:  None.  INDICATIONS:  Ms. [name] [name] is a very pleasant 53 year old female with a 2- year history of worsening right hip pain.  Plain films do not show significant arthritic changes in her hip but an MRI did confirm degenerative labral tears of her hip, periarticular osteophytes, and sclerotic changes.  She has failed conservative treatment and did see an hip arthroscopy specialist who recommended total hip arthroplasty.  Her pain has been worsening and severe.  She has failed alternative methods of treatment and has gotten to where it is affecting her activities of daily living including walking, getting up out of bed, going to the bathroom.  All these are quite painful to her, and her mobility is limited.  Now, her quality of life is diminished.  She understands the risks of acute blood loss anemia, nerve and vessel injury, fracture, infection, dislocation, polyethylene wear as well  as DVT.  She understands the goals are decreased pain, improved mobility, and overall improved quality of life.  PROCEDURE DESCRIPTION:  After informed consent was obtained, appropriate right hip was marked.  She was brought to the operating room and spinal anesthesia was obtained while she was on her stretcher.  A Foley catheter was placed, and both feet had traction boots applied to them. Next, she was placed supine on the hana fracture table with perineal post in place and both legs in inline skeletal traction devices, but no traction applied.  Her right operative hip was then prepped and draped with DuraPrep and sterile drapes.  A time-out was called to identify the correct patient, correct right hip.  We then made an incision just inferior and posterior to the anterior superior iliac spine and carried this obliquely down the leg.  We dissected down to the tensor fascia lata muscle, and the tensor fascia was then divided longitudinally to ride access to the hip.  We then proceeded with a direct anterior approach.  We cauterized the lateral femoral circumflex vessels and then put Cobra retractors around the lateral neck and up underneath the rectus femoris on the medial neck.  I opened up the hip capsule in an L- type format and found a moderate effusion.  I placed the Cobra retractors within the hip capsule,  and then made my femoral neck cut proximal to the lesser trochanter using an oscillating saw and completed this with an osteotome.  I placed a corkscrew guide in the femoral head and removed the femoral head in its entirety and found just minimal cartilage changes that one could see visually.  There was degenerative labral tearing throughout.  I used a knife and forceps to remove the remnants of acetabular labrum.  We then began reaming.  We placed a bent Hohmann medially and a Cobra retractor laterally and then began reaming in 2 mm increments from a size 42 up to a size 50 with  all reamers under direct visualization, the last reamer also under direct fluoroscopy; so, we could obtain our depth of reaming under inclination and anteversion. Once we are pleased with this, we placed the real DePuy Sector Gription acetabular component size 50, an apex hole eliminator guide and a single screw.  Attention was then turned to the femur with the leg externally rotated to 100 degrees, extended and adducted.  We were able to place a Mueller retractor medially and Hohmann retractor behind the greater trochanter.  I released the lateral joint capsule, and used a box cutting osteotome to enter the femoral canal and a rongeur to lateralize.  I used a starter broach and then just a size 8 only broach. Using the Corail broaching system, we calcar planed off of this.  It was a nice tight fit.  So, we trialed a standard neck and a 32+ 1 trial hip ball.  We brought the leg back over and up with traction and internal rotation reducing the pelvis.  Her offset and leg lengths were measured equal, and she was stable past 90 degrees of external rotation and 40 degrees of internal rotation.  We then dislocated the hip and removed the trial components.  We replaced the real size 8 Corail femoral component and a real 32+ 1 ceramic hip ball reducing the back in the acetabulum; again, it was stable.  We copiously irrigated the soft tissues with normal saline solution.  We were able to close the joint capsule with interrupted #1 Ethibond suture followed by a running #1 Vicryl in the tensor fascia, 0 Vicryl the deep tissue, 2-0 Vicryl in subcutaneous tissue, 4-0 Monocryl subcuticular stitch, Steri-Strips, and Aquacel dressing.  She was taken off the hana table and was taken to the recovery room in stable condition.  All final counts were correct, and there were no complications noted.  Of note, Richardean Canal, PA-C, assisted during the entire case and his assistance was crucial for completing  this case, facilitated and completed the retracting and layered closure of the wound.     Vanita Panda. Magnus Ivan, M.D.     CYB/MEDQ  D:  12/24/2013  T:  12/24/2013  Job:  [id number]

## 2013-12-24 NOTE — Transfer of Care (Signed)
Immediate Anesthesia Transfer of Care Note  Patient: [name] BoardsChristine V [name]  Procedure(s) Performed: Procedure(s) (LRB): RIGHT TOTAL HIP ARTHROPLASTY ANTERIOR APPROACH (Right)  Patient Location: PACU  Anesthesia Type: Spinal  Level of Consciousness: sedated, patient cooperative and responds to stimulation  Airway & Oxygen Therapy: Patient Spontanous Breathing and Patient connected to face mask oxgen  Post-op Assessment: Report given to PACU RN and Post -op Vital signs reviewed and stable  Post vital signs: Reviewed and stable  Complications: No apparent anesthesia complications

## 2013-12-24 NOTE — Progress Notes (Signed)
CARE MANAGEMENT NOTE 12/24/2013  Patient:  [name] [name],[name] [name]   Account Number:  [id number]  Date Initiated:  12/24/2013  Documentation initiated by:  Lancelot Alyea  Subjective/Objective Assessment:   total right hip replacement     Action/Plan:   home with hhc   Anticipated DC Date:  12/27/2013   Anticipated DC Plan:  HOME W HOME HEALTH SERVICES  In-house referral  NA      DC Planning Services  CM consult      [facility]  [facility]   Choice offered to / List presented to:  C-1 Patient        HH arranged  HH-2 PT      Status of service:  In process, will continue to follow Medicare Important Message given?  NA - LOS <3 / Initial given by admissions (If response is "NO", the following Medicare IM given date fields will be blank) Date Medicare IM given:   Medicare IM given by:   Date Additional Medicare IM given:   Additional Medicare IM given by:    Discharge Disposition:    Per UR Regulation:  Reviewed for med. necessity/level of care/duration of stay  If discussed at Long Length of Stay Meetings, dates discussed:    Comments:  [id number]/[name] Dudek Stark JockDavis, RN, BSN, [place] [phone] Chart Reviewed for discharge and hospital needs. Discharge needs at time of review: None present will follow for needs. Review of patient progress due on [id number]

## 2013-12-25 LAB — CBC
HCT: 34.1 % — ABNORMAL LOW (ref 36.0–46.0)
Hemoglobin: 11 g/dL — ABNORMAL LOW (ref 12.0–15.0)
MCH: 25.8 pg — ABNORMAL LOW (ref 26.0–34.0)
MCHC: 32.3 g/dL (ref 30.0–36.0)
MCV: 80 fL (ref 78.0–100.0)
PLATELETS: 170 10*3/uL (ref 150–400)
RBC: 4.26 MIL/uL (ref 3.87–5.11)
RDW: 15.6 % — ABNORMAL HIGH (ref 11.5–15.5)
WBC: 10.9 10*3/uL — AB (ref 4.0–10.5)

## 2013-12-25 LAB — BASIC METABOLIC PANEL
ANION GAP: 12 (ref 5–15)
BUN: 6 mg/dL (ref 6–23)
CHLORIDE: 103 meq/L (ref 96–112)
CO2: 22 meq/L (ref 19–32)
Calcium: 8.6 mg/dL (ref 8.4–10.5)
Creatinine, Ser: 0.6 mg/dL (ref 0.50–1.10)
GFR calc Af Amer: >90 mL/min (ref >90)
GFR calc non Af Amer: >90 mL/min (ref >90)
Glucose, Bld: 117 mg/dL — ABNORMAL HIGH (ref 70–99)
Potassium: 3.7 mEq/L (ref 3.7–5.3)
Sodium: 137 mEq/L (ref 137–147)

## 2013-12-25 NOTE — Progress Notes (Signed)
Physical Therapy Treatment Patient Details Name: [name] BoardsChristine V [name] MRN: [id number] DOB: 05/28/1960 Today's Date: 12/25/2013    History of Present Illness R THR - ant dir    PT Comments      Follow Up Recommendations  Home health PT     Equipment Recommendations  Rolling walker with 5" wheels    Recommendations for Other Services OT consult     Precautions / Restrictions Precautions Precautions: Fall Restrictions Weight Bearing Restrictions: No Other Position/Activity Restrictions: WBAT    Mobility  Bed Mobility Overal bed mobility: Needs Assistance Bed Mobility: Supine to Sit;Sit to Supine     Supine to sit: Min assist Sit to supine: Min assist   General bed mobility comments: cues for sequence and use of L LE to self assist  Transfers Overall transfer level: Needs assistance Equipment used: Rolling walker (2 wheeled) Transfers: Sit to/from Stand Sit to Stand: Min guard         General transfer comment: cues for LE management and use of UEs to self assist  Ambulation/Gait Ambulation/Gait assistance: Min guard Ambulation Distance (Feet): 123 Feet Assistive device: Rolling walker (2 wheeled) Gait Pattern/deviations: Step-to pattern;Decreased step length - right;Decreased step length - left;Shuffle;Trunk flexed Gait velocity: decr   General Gait Details: cues for posture, sequence and position from [employer] [employer]   Stairs            Wheelchair Mobility    Modified Rankin (Stroke Patients Only)       Balance                                    Cognition Arousal/Alertness: Awake/alert Behavior During Therapy: WFL for tasks assessed/performed Overall Cognitive Status: Within Functional Limits for tasks assessed                      Exercises Total Joint Exercises Ankle Circles/Pumps: AROM;Both;10 reps;Supine Quad Sets: AROM;Both;10 reps;Supine Heel Slides: AAROM;Right;Supine;20 reps Hip ABduction/ADduction:  AAROM;Right;Supine;15 reps    General Comments        Pertinent Vitals/Pain Pain Assessment: 0-10 Pain Score: 5  Pain Location: R hip  Pain Descriptors / Indicators: Aching;Burning Pain Intervention(s): Ice applied;Limited activity within patient's tolerance;Premedicated before session    Home Living                      Prior Function            PT Goals (current goals can now be found in the care plan section) Acute Rehab PT Goals Patient Stated Goal: go to beach for Labor Day weekend PT Goal Formulation: With patient Time For Goal Achievement: 12/31/13 Potential to Achieve Goals: Good Progress towards PT goals: Progressing toward goals    Frequency  7X/week    PT Plan Current plan remains appropriate    Co-evaluation             End of Session Equipment Utilized During Treatment: Gait belt Activity Tolerance: Patient tolerated treatment well Patient left: in chair;with call bell/phone within reach;with family/visitor present     Time: 3664-40341354-1424 PT Time Calculation (min): 30 min  Charges:  $Gait Training: 8-22 mins $Therapeutic Exercise: 8-22 mins                    G Codes:      [name] [name] 12/25/2013, 3:12 PM

## 2013-12-25 NOTE — Progress Notes (Signed)
Subjective: 1 Day Post-Op Procedure(s) (LRB): RIGHT TOTAL HIP ARTHROPLASTY ANTERIOR APPROACH (Right) Patient reports pain as moderate to severe.  Up with therapy in hallway this AM.  Objective: Vital signs in last 24 hours: Temp:  [97.5 F (36.4 C)-98.7 F (37.1 C)] 98.7 F (37.1 C) (08/07 1002) Pulse Rate:  [65-85] 68 (08/07 1002) Resp:  [13-18] 16 (08/07 1002) BP: (101-132)/(55-78) 108/55 mmHg (08/07 1002) SpO2:  [98 %-100 %] 100 % (08/07 1002) Weight:  [72.3 kg (159 lb 6.3 oz)] 72.3 kg (159 lb 6.3 oz) (08/06 1053)  Intake/Output from previous day: 08/06 0701 - 08/07 0700 In: 6440.5 [P.O.:2880; I.V.:3560.5] Out: 5100 [Urine:4650; Blood:450] Intake/Output this shift: Total I/O In: 600 [P.O.:600] Out: 400 [Urine:400]   Recent Labs  12/25/13 0440  HGB 11.0*    Recent Labs  12/25/13 0440  WBC 10.9*  RBC 4.26  HCT 34.1*  PLT 170    Recent Labs  12/25/13 0440  NA 137  K 3.7  CL 103  CO2 22  BUN 6  CREATININE 0.60  GLUCOSE 117*  CALCIUM 8.6   No results found for this basename: LABPT, INR,  in the last 72 hours  Dorsiflexion/Plantar flexion intact Alert and oriented.  Assessment/Plan: 1 Day Post-Op Procedure(s) (LRB): RIGHT TOTAL HIP ARTHROPLASTY ANTERIOR APPROACH (Right) Up with therapy dispo home with home health in next 1-2 days  [name] [name] 12/25/2013, 10:15 AM

## 2013-12-25 NOTE — Progress Notes (Addendum)
Pt refused to allow d/c of foley at this time pt also having menses,Ivf rate decreased to 6330ml/hr.C/o feeling stiff this am,but denied needing assistance when offered to turn pt q 2hrs w/a. Linward HeadlandBeverly, Karri Kallenbach D

## 2013-12-25 NOTE — Evaluation (Signed)
Occupational Therapy Evaluation Patient Details Name: TONEY LIZAOLA MRN: [id number] DOB: 04-07-1961 Today's Date: 12/25/2013    History of Present Illness R THR - ant dir   Clinical Impression   This 53 year old female was admitted for the above surgery.  She was limited by pain and stiffness today. She will benefit from continued OT in acute to increase safety and independence with adls with overall supervision level goals except for bathing/dressing, for which she has assistance    Follow Up Recommendations  No OT follow up    Equipment Recommendations  None recommended by OT    Recommendations for Other Services       Precautions / Restrictions Precautions Precautions: Fall Restrictions Other Position/Activity Restrictions: WBAT      Mobility Bed Mobility         Supine to sit: Min assist     General bed mobility comments: assist for RLE  Transfers     Transfers: Sit to/from Stand Sit to Stand: Min assist         General transfer comment: cues for LE management and use of UEs to self assist    Balance                                            ADL Overall ADL's : Needs assistance/impaired     Grooming: Set up;Wash/dry hands;Sitting   Upper Body Bathing: Set up;Sitting   Lower Body Bathing: Moderate assistance;Sit to/from stand   Upper Body Dressing : Set up;Sitting   Lower Body Dressing: Maximal assistance;Sit to/from stand   Toilet Transfer: Minimal assistance;BSC;Ambulation   Toileting- Architect and Hygiene: Min guard;Sit to/from stand         General ADL Comments: ambulated to bathroom, performed hygiene and donned underwear.  Educated on shower transfer but pt was in too much pain to attempt.  Pt c/o high pain due to stiffness--improved a little at end of session  Mother and son will assist with adls at home.  She is not interested in AE     Vision                     Perception      Praxis      Pertinent Vitals/Pain Pain Assessment: 0-10 Pain Score: 8  Pain Location: R hip and thigh Pain Descriptors / Indicators:  (stiff) Pain Intervention(s): Repositioned;Patient requesting pain meds-RN notified     Hand Dominance Right   Extremity/Trunk Assessment Upper Extremity Assessment Upper Extremity Assessment: Overall WFL for tasks assessed           Communication Communication Communication: No difficulties   Cognition Arousal/Alertness: Awake/alert Behavior During Therapy: WFL for tasks assessed/performed Overall Cognitive Status: Within Functional Limits for tasks assessed                     General Comments       Exercises       Shoulder Instructions      Home Living Family/patient expects to be discharged to:: Private residence Living Arrangements: Spouse/significant other Available Help at Discharge: Family Type of Home: House Home Access: Stairs to enter Secretary/administrator of Steps: 4 Entrance Stairs-Rails: None Home Layout: One level     Bathroom Shower/Tub: Producer, television/film/video: Standard     Home Equipment: Shower seat;Bedside commode  Prior Functioning/Environment Level of Independence: Independent             OT Diagnosis: Generalized weakness;Acute pain   OT Problem List: Decreased strength;Decreased activity tolerance;Decreased knowledge of use of DME or AE;Pain   OT Treatment/Interventions: Self-care/ADL training;DME and/or AE instruction;Patient/family education    OT Goals(Current goals can be found in the care plan section) Acute Rehab OT Goals Patient Stated Goal: go to beach for Labor Day weekend OT Goal Formulation: With patient Time For Goal Achievement: 01/01/14 Potential to Achieve Goals: Good ADL Goals Pt Will Perform Grooming: with supervision;standing Pt Will Transfer to Toilet: with supervision;ambulating;bedside commode Pt Will Perform Toileting - Clothing  Manipulation and hygiene: with supervision;sit to/from stand Pt Will Perform Tub/Shower Transfer: with min guard assist;ambulating;shower seat;Shower transfer  OT Frequency: Min 2X/week   Barriers to D/C:            Co-evaluation              End of Session    Activity Tolerance: Patient limited by pain;Patient limited by fatigue (pt slept very poorly last night) Patient left: with call bell/phone within reach;with family/visitor present;in chair   Time: [id number] OT Time Calculation (min): 27 min Charges:  OT General Charges $OT Visit: 1 Procedure OT Evaluation $Initial OT Evaluation Tier I: 1 Procedure OT Treatments $Self Care/Home Management : 8-22 mins G-Codes:    Keatyn Jawad 12/25/2013, 10:18 AM   Marica OtterMaryellen Ervin Rothbauer, OTR/L [phone] 12/25/2013

## 2013-12-25 NOTE — Progress Notes (Signed)
Physical Therapy Treatment Patient Details Name: Barkley BoardsChristine V Hendriks MRN: [id number] DOB: 12/07/1960 Today's Date: 12/25/2013    History of Present Illness R THR - ant dir    PT Comments    Pt continues motivated but pain limited this am.  Follow Up Recommendations  Home health PT     Equipment Recommendations  Rolling walker with 5" wheels    Recommendations for Other Services OT consult     Precautions / Restrictions Precautions Precautions: Fall Restrictions Weight Bearing Restrictions: No Other Position/Activity Restrictions: WBAT    Mobility  Bed Mobility         Supine to sit: Min assist     General bed mobility comments: assist for RLE  Transfers Overall transfer level: Needs assistance Equipment used: Rolling walker (2 wheeled) Transfers: Sit to/from Stand Sit to Stand: Min assist         General transfer comment: cues for LE management and use of UEs to self assist  Ambulation/Gait Ambulation/Gait assistance: Min assist Ambulation Distance (Feet): 96 Feet Assistive device: Rolling walker (2 wheeled) Gait Pattern/deviations: Step-to pattern;Decreased step length - right;Decreased step length - left;Shuffle;Trunk flexed;Antalgic Gait velocity: decr   General Gait Details: cues for posture, sequence and position from [employer]   Stairs            Wheelchair Mobility    Modified Rankin (Stroke Patients Only)       Balance                                    Cognition Arousal/Alertness: Awake/alert Behavior During Therapy: WFL for tasks assessed/performed Overall Cognitive Status: Within Functional Limits for tasks assessed                      Exercises      General Comments        Pertinent Vitals/Pain Pain Assessment: 0-10 Pain Score: 6  Pain Location: R hip and ant thigh Pain Descriptors / Indicators: Aching;Burning Pain Intervention(s): Limited activity within patient's tolerance;Premedicated before  session;Ice applied    Home Living Family/patient expects to be discharged to:: Private residence Living Arrangements: Spouse/significant other Available Help at Discharge: Family Type of Home: House Home Access: Stairs to enter Entrance Stairs-Rails: None Home Layout: One level Home Equipment: Shower seat;Bedside commode      Prior Function Level of Independence: Independent          PT Goals (current goals can now be found in the care plan section) Acute Rehab PT Goals Patient Stated Goal: go to beach for Labor Day weekend PT Goal Formulation: With patient Time For Goal Achievement: 12/31/13 Potential to Achieve Goals: Good Progress towards PT goals: Progressing toward goals    Frequency  7X/week    PT Plan Current plan remains appropriate    Co-evaluation             End of Session   Activity Tolerance: Patient tolerated treatment well Patient left: in chair;with call bell/phone within reach;with family/visitor present     Time: 1010-1026 PT Time Calculation (min): 16 min  Charges:  $Gait Training: 8-22 mins                    G Codes:      Kyira Volkert 12/25/2013, 12:43 PM

## 2013-12-26 LAB — CBC
HEMATOCRIT: 29.8 % — AB (ref 36.0–46.0)
HEMOGLOBIN: 9.2 g/dL — AB (ref 12.0–15.0)
MCH: 25.4 pg — ABNORMAL LOW (ref 26.0–34.0)
MCHC: 30.9 g/dL (ref 30.0–36.0)
MCV: 82.3 fL (ref 78.0–100.0)
Platelets: 170 10*3/uL (ref 150–400)
RBC: 3.62 MIL/uL — ABNORMAL LOW (ref 3.87–5.11)
RDW: 15.6 % — AB (ref 11.5–15.5)
WBC: 7.3 10*3/uL (ref 4.0–10.5)

## 2013-12-26 MED ORDER — OXYCODONE-ACETAMINOPHEN 5-325 MG PO TABS: 1.0000 | ORAL_TABLET | ORAL | Status: DC | PRN

## 2013-12-26 MED ORDER — ASPIRIN 325 MG PO TBEC: 325.0000 mg | DELAYED_RELEASE_TABLET | Freq: Two times a day (BID) | ORAL | Status: DC

## 2013-12-26 MED ORDER — METHOCARBAMOL 500 MG PO TABS: 500.0000 mg | ORAL_TABLET | Freq: Three times a day (TID) | ORAL | Status: DC | PRN

## 2013-12-26 NOTE — Progress Notes (Signed)
Physical Therapy Treatment Patient Details Name: [name] BoardsChristine V [name] MRN: [id number] DOB: 04/14/1961 Today's Date: 12/26/2013    History of Present Illness R THR - ant dir    PT Comments    Progressing well.  Reviewed stairs and car transfers this date.  Follow Up Recommendations  Home health PT     Equipment Recommendations  Rolling walker with 5" wheels    Recommendations for Other Services OT consult     Precautions / Restrictions Precautions Precautions: Fall Restrictions Weight Bearing Restrictions: No Other Position/Activity Restrictions: WBAT    Mobility  Bed Mobility               General bed mobility comments: Pt declines to attempt - stating preference for chair  Transfers Overall transfer level: Needs assistance Equipment used: Rolling walker (2 wheeled) Transfers: Sit to/from Stand Sit to Stand: Supervision         General transfer comment: cues for LE position when sitting  Ambulation/Gait Ambulation/Gait assistance: Min guard;Supervision Ambulation Distance (Feet): 100 Feet Assistive device: Standard walker Gait Pattern/deviations: Step-to pattern;Decreased step length - right;Decreased step length - left;Shuffle;Trunk flexed Gait velocity: decr   General Gait Details: cues for posture, sequence and position from RW   Stairs Stairs: Yes Stairs assistance: Min assist Stair Management: No rails;Step to pattern;Backwards;With walker Number of Stairs: 4 General stair comments: cues for sequence and foot/SW placement  Wheelchair Mobility    Modified Rankin (Stroke Patients Only)       Balance                                    Cognition Arousal/Alertness: Awake/alert Behavior During Therapy: WFL for tasks assessed/performed Overall Cognitive Status: Within Functional Limits for tasks assessed                      Exercises      General Comments        Pertinent Vitals/Pain Pain Assessment:  0-10 Pain Score: 4  Pain Location: R hip Pain Descriptors / Indicators: Aching Pain Intervention(s): Limited activity within patient's tolerance;Patient requesting pain meds-RN notified;Ice applied    Home Living                      Prior Function            PT Goals (current goals can now be found in the care plan section) Acute Rehab PT Goals Patient Stated Goal: go to beach for Labor Day weekend PT Goal Formulation: With patient Time For Goal Achievement: 12/31/13 Potential to Achieve Goals: Good Progress towards PT goals: Progressing toward goals    Frequency  7X/week    PT Plan Current plan remains appropriate    Co-evaluation             End of Session Equipment Utilized During Treatment: Gait belt Activity Tolerance: Patient tolerated treatment well Patient left: in chair;with call bell/phone within reach;with family/visitor present     Time: 1350-1414 PT Time Calculation (min): 24 min  Charges:  $Gait Training: 8-22 mins $Therapeutic Activity: 8-22 mins                    G Codes:      Renalda Locklin 12/26/2013, 2:19 PM

## 2013-12-26 NOTE — Progress Notes (Signed)
Subjective: 2 Days Post-Op Procedure(s) (LRB): RIGHT TOTAL HIP ARTHROPLASTY ANTERIOR APPROACH (Right) Patient reports pain as moderate.  Better since yesterday.  Asymptomatic acute blood loss anemia.  Objective: Vital signs in last 24 hours: Temp:  [98.4 F (36.9 C)-98.8 F (37.1 C)] 98.8 F (37.1 C) (08/08 0539) Pulse Rate:  [68-95] 95 (08/08 0539) Resp:  [15-17] 17 (08/08 0539) BP: (100-109)/(45-55) 109/51 mmHg (08/08 0539) SpO2:  [94 %-100 %] 100 % (08/08 0539)  Intake/Output from previous day: 08/07 0701 - 08/08 0700 In: 2127.3 [P.O.:1320; I.V.:807.3] Out: 3650 [Urine:3650] Intake/Output this shift: Total I/O In: -  Out: 425 [Urine:425]   Recent Labs  12/25/13 0440 12/26/13 0545  HGB 11.0* 9.2*    Recent Labs  12/25/13 0440 12/26/13 0545  WBC 10.9* 7.3  RBC 4.26 3.62*  HCT 34.1* 29.8*  PLT 170 170    Recent Labs  12/25/13 0440  NA 137  K 3.7  CL 103  CO2 22  BUN 6  CREATININE 0.60  GLUCOSE 117*  CALCIUM 8.6   No results found for this basename: LABPT, INR,  in the last 72 hours  Sensation intact distally Intact pulses distally Dorsiflexion/Plantar flexion intact Incision: dressing C/D/I  Assessment/Plan: 2 Days Post-Op Procedure(s) (LRB): RIGHT TOTAL HIP ARTHROPLASTY ANTERIOR APPROACH (Right) Up with therapy Plan for discharge tomorrow  Kathryne HitchBLACKMAN,Veatrice Eckstein Y 12/26/2013, 9:21 AM

## 2013-12-26 NOTE — Progress Notes (Signed)
Physical Therapy Treatment Patient Details Name: [name] [name] MRN: [id number] DOB: 03/18/1961 Today's Date: 12/26/2013    History of Present Illness R THR - ant dir    PT Comments    Progressing well, will attempt stairs this pm  Follow Up Recommendations  Home health PT     Equipment Recommendations  Rolling walker with 5" wheels    Recommendations for Other Services OT consult     Precautions / Restrictions Precautions Precautions: Fall Restrictions Weight Bearing Restrictions: No Other Position/Activity Restrictions: WBAT    Mobility  Bed Mobility                  Transfers Overall transfer level: Needs assistance Equipment used: Rolling walker (2 wheeled) Transfers: Sit to/from Stand Sit to Stand: Supervision         General transfer comment: cues for LE position when sitting  Ambulation/Gait Ambulation/Gait assistance: Min guard;Supervision Ambulation Distance (Feet): 150 Feet Assistive device: Rolling walker (2 wheeled) Gait Pattern/deviations: Step-to pattern;Shuffle;Trunk flexed Gait velocity: decr   General Gait Details: cues for posture, sequence and position from [facility]   Stairs            Wheelchair Mobility    Modified Rankin (Stroke Patients Only)       Balance                                    Cognition Arousal/Alertness: Awake/alert Behavior During Therapy: WFL for tasks assessed/performed Overall Cognitive Status: Within Functional Limits for tasks assessed                      Exercises Total Joint Exercises Ankle Circles/Pumps: AROM;Both;10 reps;Supine Quad Sets: AROM;Both;10 reps;Supine Gluteal Sets: AROM;Both;10 reps;Supine Heel Slides: AAROM;Right;Supine;20 reps Hip ABduction/ADduction: AAROM;Right;Supine;15 reps Long Arc Quad: AROM;Right;Seated;15 reps    General Comments        Pertinent Vitals/Pain Pain Assessment: 0-10 Pain Score: 2  Pain Location: R hip Pain  Descriptors / Indicators: Aching Pain Intervention(s): Limited activity within patient's tolerance;Repositioned    Home Living                      Prior Function            PT Goals (current goals can now be found in the care plan section) Acute Rehab PT Goals Patient Stated Goal: [place] beach [place] weekend PT Goal Formulation: With patient Time For Goal Achievement: 12/31/13 Potential to Achieve Goals: Good Progress towards PT goals: Progressing toward goals    Frequency  7X/week    PT Plan Current plan remains appropriate    Co-evaluation             End of Session Equipment Utilized During Treatment: Gait belt Activity Tolerance: Patient tolerated treatment well Patient left: in chair;with call bell/phone within reach;with family/visitor present     Time: 0936-1006 PT Time Calculation (min): 30 min  Charges:  $Gait Training: 8-22 mins $Therapeutic Exercise: 8-22 mins                    G Codes:      Dezyrae Kensinger 12/26/2013, 12:36 PM

## 2013-12-26 NOTE — Progress Notes (Signed)
Occupational Therapy Treatment Patient Details Name: [name] [name] MRN: [id number] DOB: 01/13/1961 Today's Date: 12/26/2013    History of present illness R THR - ant dir   OT comments  Pt feeling much better today.  Goals met; no further OT needs  Follow Up Recommendations  No OT follow up    Equipment Recommendations  None recommended by OT    Recommendations for Other Services      Precautions / Restrictions Precautions Precautions: Fall Restrictions Weight Bearing Restrictions: No Other Position/Activity Restrictions: WBAT       Mobility Bed Mobility                  Transfers     Transfers: Sit to/from Stand Sit to Stand: Supervision         General transfer comment: cues for LE position when sitting    Balance                                   ADL Overall ADL's : Needs assistance/impaired     Grooming: Wash/dry hands;Set up;Standing                   Toilet Transfer: Supervision/safety;Ambulation;BSC   Toileting- Water quality scientist and Hygiene: Supervision/safety;Sit to/from stand   Tub/ Shower Transfer: Walk-in shower;Min guard;Ambulation     General ADL Comments: simulated shorter shower ledge and practiced backing in and coming out forward      Vision                     Perception     Praxis      Cognition   Behavior During Therapy: [facility] for tasks assessed/performed Overall Cognitive Status: Within Functional Limits for tasks assessed                       Extremity/Trunk Assessment               Exercises     Shoulder Instructions       General Comments      Pertinent Vitals/ Pain       Pain Assessment: 0-10 Pain Score: 2  Pain Location: R hip Pain Descriptors / Indicators: Aching Pain Intervention(s): Limited activity within patient's tolerance;Repositioned  Home Living                                          Prior  Functioning/Environment              Frequency       Progress Toward Goals  OT Goals(current goals can now be found in the care plan section)  Progress towards OT goals: Goals met/education completed, patient discharged from [place]      Co-evaluation                 End of Session     Activity Tolerance Patient tolerated treatment well   Patient Left in chair;with call bell/phone within reach;with family/visitor present   Nurse Communication          Time: 1517-6160 OT Time Calculation (min): 13 min  Charges: OT General Charges $OT Visit: 1 Procedure OT Treatments $Self Care/Home Management : 8-22 mins  Milany Geck 12/26/2013, 10:26 AM Lesle Chris, OTR/L [phone] 12/26/2013

## 2013-12-27 LAB — CBC
HCT: 29.6 % — ABNORMAL LOW (ref 36.0–46.0)
Hemoglobin: 9.3 g/dL — ABNORMAL LOW (ref 12.0–15.0)
MCH: 25.8 pg — ABNORMAL LOW (ref 26.0–34.0)
MCHC: 31.4 g/dL (ref 30.0–36.0)
MCV: 82 fL (ref 78.0–100.0)
Platelets: 174 10*3/uL (ref 150–400)
RBC: 3.61 MIL/uL — AB (ref 3.87–5.11)
RDW: 15.4 % (ref 11.5–15.5)
WBC: 6.8 10*3/uL (ref 4.0–10.5)

## 2013-12-27 NOTE — Discharge Summary (Signed)
Patient ID: Barkley BoardsChristine V Loy MRN: [id number] DOB/AGE: 51/12/1960 51 y.o.  Admit date: 12/24/2013 Discharge date: 12/27/2013  Admission Diagnoses:  Principal Problem:   Arthritis of right hip Active Problems:   Status post total replacement of right hip   Discharge Diagnoses:  Same  Past Medical History  Diagnosis Date  . Raynaud disease   . Arthritis   . Dysrhythmia     palpitations per patient     Surgeries: Procedure(s): RIGHT TOTAL HIP ARTHROPLASTY ANTERIOR APPROACH on 12/24/2013   Consultants:    Discharged Condition: Improved  Hospital Course: Barkley BoardsChristine V Costilla is an 53 y.o. female who was admitted 12/24/2013 for operative treatment ofArthritis of right hip. Patient has severe unremitting pain that affects sleep, daily activities, and work/hobbies. After pre-op clearance the patient was taken to the operating room on 12/24/2013 and underwent  Procedure(s): RIGHT TOTAL HIP ARTHROPLASTY ANTERIOR APPROACH.    Patient was given perioperative antibiotics: Anti-infectives   Start     Dose/Rate Route Frequency Ordered Stop   12/24/13 1400  ceFAZolin (ANCEF) IVPB 1 g/50 mL premix     1 g 100 mL/hr over 30 Minutes Intravenous Every 6 hours 12/24/13 1101 12/24/13 2027   12/24/13 0602  ceFAZolin (ANCEF) IVPB 2 g/50 mL premix     2 g 100 mL/hr over 30 Minutes Intravenous On call to O.R. 12/24/13 0602 12/24/13 0745       Patient was given sequential compression devices, early ambulation, and chemoprophylaxis to prevent DVT.  Patient benefited maximally from hospital stay and there were no complications.    Recent vital signs: Patient Vitals for the past 24 hrs:  BP Temp Temp src Pulse Resp SpO2  12/27/13 0437 109/54 mmHg 98.5 F (36.9 C) Oral 83 16 99 %  12/26/13 2100 104/53 mmHg 99.3 F (37.4 C) Oral 93 18 100 %  12/26/13 1508 108/51 mmHg 99.4 F (37.4 C) Oral 94 16 98 %     Recent laboratory studies:  Recent Labs  12/25/13 0440 12/26/13 0545 12/27/13 0423  WBC  10.9* 7.3 6.8  HGB 11.0* 9.2* 9.3*  HCT 34.1* 29.8* 29.6*  PLT 170 170 174  NA 137  --   --   K 3.7  --   --   CL 103  --   --   CO2 22  --   --   BUN 6  --   --   CREATININE 0.60  --   --   GLUCOSE 117*  --   --   CALCIUM 8.6  --   --      Discharge Medications:     Medication List    STOP taking these medications       naproxen sodium 220 MG tablet  Commonly known as:  ANAPROX      TAKE these medications       aspirin 325 MG EC tablet  Take 1 tablet (325 mg total) by mouth 2 (two) times daily after a meal.     methocarbamol 500 MG tablet  Commonly known as:  ROBAXIN  Take 1 tablet (500 mg total) by mouth every 8 (eight) hours as needed for muscle spasms.     oxyCODONE-acetaminophen 5-325 MG per tablet  Commonly known as:  ROXICET  Take 1-2 tablets by mouth every 4 (four) hours as needed for severe pain.        Diagnostic Studies: Dg Hip Complete Right  12/24/2013   CLINICAL DATA:  Preop for right total hip replacement  EXAM: RIGHT HIP - COMPLETE 2+ VIEW  COMPARISON:  None  FINDINGS: C-arm fluoroscopy was provided during right hip replacement. On the last films the femoral and acetabular components of the right hip replacement appear to be in good position and alignment. No complicating features are seen.  IMPRESSION: Right total hip replacement.  No complicating features.   Electronically Signed   By: Dwyane Dee M.D.   On: 12/24/2013 09:32   Dg Pelvis Portable  12/24/2013   CLINICAL DATA:  Postop right hip arthroplasty  EXAM: PORTABLE PELVIS 1-2 VIEWS  COMPARISON:  None.  FINDINGS: Right hip prosthetic components are well-seated and aligned on this single view. There is no acute fracture or evidence of an operative complication.  IMPRESSION: Well-aligned right hip prosthesis.   Electronically Signed   By: Amie Portland M.D.   On: 12/24/2013 10:32   Dg Hip Portable 1 View Right  12/24/2013   CLINICAL DATA:  Postop evaluation.  EXAM: PORTABLE RIGHT HIP - 1 VIEW   COMPARISON:  12/24/2013  FINDINGS: Single view appears to represent a cross-table lateral view. The right total hip arthroplasty is located. No evidence for a periprosthetic fracture.  IMPRESSION: Right hip arthroplasty without complicating features.   Electronically Signed   By: Richarda Overlie M.D.   On: 12/24/2013 10:33   Dg C-arm 1-60 Min-no Report  12/24/2013   CLINICAL DATA: right hip   C-ARM 1-60 MINUTES  Fluoroscopy was utilized by the requesting physician.  No radiographic  interpretation.     Disposition: to home      Discharge Instructions   Call MD / Call 911    Complete by:  As directed   If you experience chest pain or shortness of breath, CALL 911 and be transported to the hospital emergency room.  If you develope a fever above 101 F, pus (white drainage) or increased drainage or redness at the wound, or calf pain, call your surgeon's office.     Constipation Prevention    Complete by:  As directed   Drink plenty of fluids.  Prune juice may be helpful.  You may use a stool softener, such as Colace (over the counter) 100 mg twice a day.  Use MiraLax (over the counter) for constipation as needed.     Diet - low sodium heart healthy    Complete by:  As directed      Discharge instructions    Complete by:  As directed   Increase activities as comfort allows. You can get your current dressing wet in the shower. You can remove your current dressing at one week post-op and start getting your actual incsion wet. Try to leave the steristrips in place; new dry dressing daily after you remove the original dressing. Do take an over-the-counter stool softener twice daily.     Discharge patient    Complete by:  As directed      Increase activity slowly as tolerated    Complete by:  As directed      Weight bearing as tolerated    Complete by:  As directed            Follow-up Information   Follow up with Kathryne Hitch, MD In 2 weeks.   Specialty:  Orthopedic Surgery    Contact information:   [address] [place] [address] [phone]        Signed: Kathryne Hitch 12/27/2013, 9:08 AM

## 2013-12-27 NOTE — Discharge Instructions (Signed)
Pickup stool softener for constipation. Weight Bearing as tolerated  No hip precautions Progress activities slowly Expect hip and possible knee soreness and swelling. Apply heat or ice as needed. Keep dressing clean dry and intact, may shower with dressing intact. On Thursday remove dressing, incision can get wet. After showering apply clean dressing.

## 2013-12-27 NOTE — Progress Notes (Signed)
Subjective: 3 Days Post-Op Procedure(s) (LRB): RIGHT TOTAL HIP ARTHROPLASTY ANTERIOR APPROACH (Right) Patient reports pain as moderate.  H/H stable.  Asymptomatic.  Doing well with therapy.  Objective: Vital signs in last 24 hours: Temp:  [98.5 F (36.9 C)-99.4 F (37.4 C)] 98.5 F (36.9 C) (08/09 0437) Pulse Rate:  [83-94] 83 (08/09 0437) Resp:  [16-18] 16 (08/09 0437) BP: (104-109)/(51-54) 109/54 mmHg (08/09 0437) SpO2:  [98 %-100 %] 99 % (08/09 0437)  Intake/Output from previous day: 08/08 0701 - 08/09 0700 In: 1320 [P.O.:1320] Out: 725 [Urine:725] Intake/Output this shift:     Recent Labs  12/25/13 0440 12/26/13 0545 12/27/13 0423  HGB 11.0* 9.2* 9.3*    Recent Labs  12/26/13 0545 12/27/13 0423  WBC 7.3 6.8  RBC 3.62* 3.61*  HCT 29.8* 29.6*  PLT 170 174    Recent Labs  12/25/13 0440  NA 137  K 3.7  CL 103  CO2 22  BUN 6  CREATININE 0.60  GLUCOSE 117*  CALCIUM 8.6   No results found for this basename: LABPT, INR,  in the last 72 hours  Sensation intact distally Intact pulses distally Dorsiflexion/Plantar flexion intact Incision: scant drainage  Assessment/Plan: 3 Days Post-Op Procedure(s) (LRB): RIGHT TOTAL HIP ARTHROPLASTY ANTERIOR APPROACH (Right) Discharge home with home health today.  Linley Moxley Y 12/27/2013, 9:05 AM

## 2015-12-06 IMAGING — DX DG PORTABLE PELVIS
1 series · 1 of 1 positions shown · non-contrast
Comparison: None.

CLINICAL DATA: Postop right hip arthroplasty

EXAM:
PORTABLE PELVIS 1-2 VIEWS

[pelvis ap]
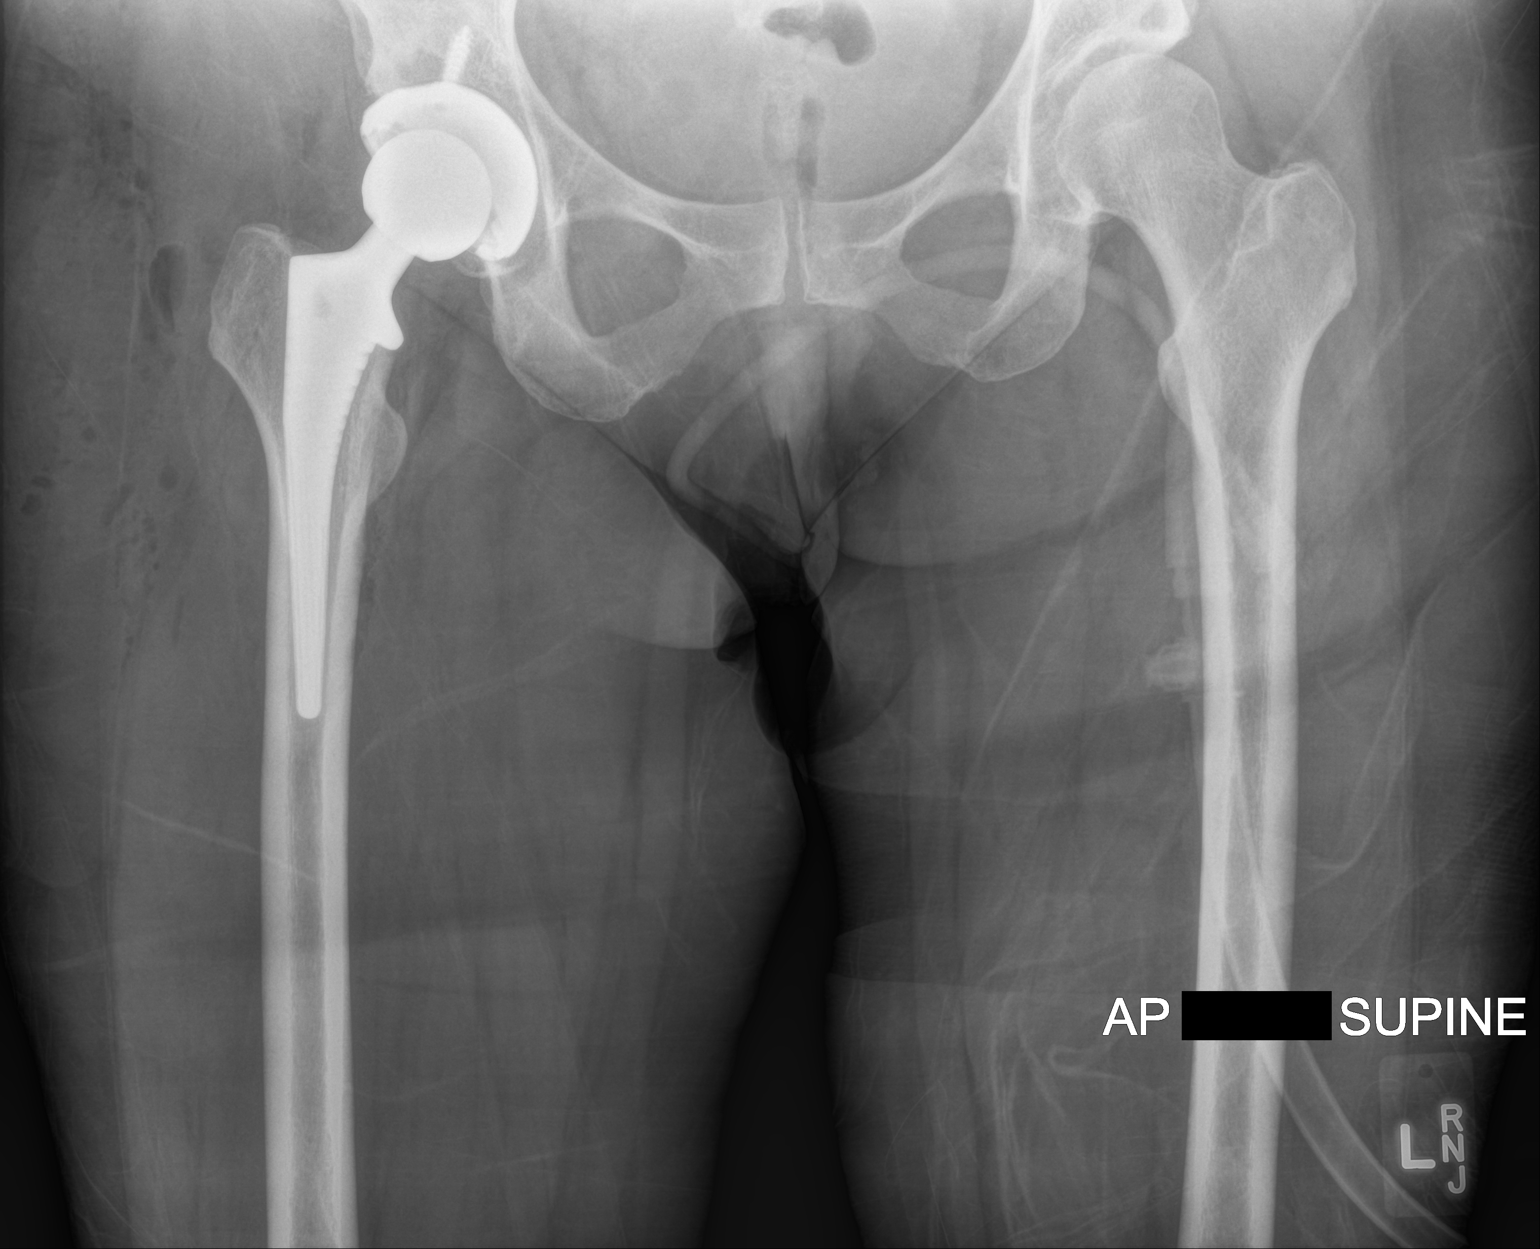

[1 of 1 positions shown; findings below may reference images not displayed]

FINDINGS: Right hip prosthetic components are well-seated and aligned on this
single view. There is no acute fracture or evidence of an operative
complication.
IMPRESSION: Well-aligned right hip prosthesis.

## 2018-04-30 ENCOUNTER — Other Ambulatory Visit: Payer: Self-pay | Admitting: Otolaryngology

## 2018-04-30 DIAGNOSIS — H9312 Tinnitus, left ear: Secondary | ICD-10-CM

## 2018-05-19 ENCOUNTER — Other Ambulatory Visit: Payer: BC Managed Care – PPO

## 2021-06-12 ENCOUNTER — Other Ambulatory Visit: Payer: Self-pay

## 2021-06-12 ENCOUNTER — Encounter: Payer: Self-pay | Admitting: Allergy and Immunology

## 2021-06-12 ENCOUNTER — Ambulatory Visit: Payer: BC Managed Care – PPO | Admitting: Allergy and Immunology

## 2021-06-12 VITALS — BP 122/82 | HR 63 | Resp 16 | Ht 63.0 in | Wt 162.8 lb

## 2021-06-12 DIAGNOSIS — K219 Gastro-esophageal reflux disease without esophagitis: Secondary | ICD-10-CM

## 2021-06-12 DIAGNOSIS — J4550 Severe persistent asthma, uncomplicated: Secondary | ICD-10-CM

## 2021-06-12 DIAGNOSIS — R7989 Other specified abnormal findings of blood chemistry: Secondary | ICD-10-CM

## 2021-06-12 MED ORDER — BREZTRI AEROSPHERE 160-9-4.8 MCG/ACT IN AERO: INHALATION_SPRAY | RESPIRATORY_TRACT | 5 refills | Status: AC

## 2021-06-12 MED ORDER — ALBUTEROL SULFATE HFA 108 (90 BASE) MCG/ACT IN AERS: INHALATION_SPRAY | RESPIRATORY_TRACT | 1 refills | Status: AC

## 2021-06-12 MED ORDER — OMEPRAZOLE 40 MG PO CPDR: DELAYED_RELEASE_CAPSULE | ORAL | 5 refills | Status: DC

## 2021-06-12 MED ORDER — MONTELUKAST SODIUM 10 MG PO TABS: 10.0000 mg | ORAL_TABLET | Freq: Every day | ORAL | 5 refills | Status: AC

## 2021-06-12 NOTE — Progress Notes (Signed)
[place] [facility] [place] - [place] - [place] - [place]   NEW PATIENT NOTE  Referring Provider: No ref. provider found Primary Provider: Marylen Ponto, MD Date of office visit: 06/12/2021    Subjective:   Chief Complaint:  [name] [name] (DOB: [birth year]) is a 61 y.o. female who presents to the clinic on 06/12/2021 with a chief complaint of Wheezing, Shortness of Breath, and Cough .     HPI: [name] [name] presents to this clinic in evaluation of asthma.  In September or October 2021 she developed acute onset of wheezing and coughing and shortness of breath and she had to use her son's albuterol and she did quite well up until September 2022 at which point in time she once again developed wheezing and coughing and shortness of breath that has been a persistent issue ever since that event.  It sounds as though she has been to the urgent care center on 2 occasions and has received systemic steroids with each evaluation since September 2022.  Systemic steroids do appear to help her for a few weeks.  Currently, she wheezes and she coughs and she must use her bronchodilator 4 times per day and she has nocturnal bronchospastic symptoms.  She also has lots of throat clearing and mucus in her throat.  She does have heartburn with spicy foods and she uses Tums at least 1 time per week.  She apparently had a chest x-ray 1 month ago at urgent care and this was "okay".  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  She has minimal nasal symptoms.  She contracted COVID in November 2022 with a flulike illness with fever and sore throat and fortunately resolved that issue without any antiviral agents.  She has been immunized for COVID on 3 occasions and has received this year's flu vaccine.  Past Medical History:  Diagnosis Date   Arthritis    Dysrhythmia    palpitations per patient    Raynaud disease     Past Surgical History:  Procedure Laterality Date    CHOLECYSTECTOMY     HERNIA REPAIR     right inguinal hernia repair    TOTAL HIP ARTHROPLASTY Right 12/24/2013   Procedure: RIGHT TOTAL HIP ARTHROPLASTY ANTERIOR APPROACH;  Surgeon: Kathryne Hitch, MD;  Location: [facility];  Service: Orthopedics;  Laterality: Right;    Allergies as of 06/12/2021   No Known Allergies      Medication List    albuterol 108 (90 Base) MCG/ACT inhaler Commonly known as: VENTOLIN HFA Inhale into the lungs.   [employer]onAir Digihaler 232 MCG/ACT Aepb Generic drug: Fluticasone Propionate(sensor) Inhale 1 puff into the lungs 2 (two) times daily.   valsartan-hydrochlorothiazide 80-12.5 MG tablet Commonly known as: DIOVAN-HCT Take 1 tablet by mouth daily.    Review of systems negative except as noted in HPI / PMHx or noted below:  Review of Systems  Constitutional: Negative.   HENT: Negative.    Eyes: Negative.   Respiratory: Negative.    Cardiovascular: Negative.   Gastrointestinal: Negative.   Genitourinary: Negative.   Musculoskeletal: Negative.   Skin: Negative.   Neurological: Negative.   Endo/Heme/Allergies: Negative.   Psychiatric/Behavioral: Negative.     Family History  Problem Relation Age of Onset   Allergic rhinitis Father    Asthma Sister    Asthma Maternal Aunt     Social History   Socioeconomic History   Marital status: Married    Spouse name: Not  on file   Number of children: Not on file   Years of education: Not on file   Highest education level: Not on file  Occupational History   Not on file  Tobacco Use   Smoking status: Former    Packs/day: 0.25    Years: 25.00    Pack years: 6.25    Types: Cigarettes   Smokeless tobacco: Never  Substance and Sexual Activity   Alcohol use: Not on file    Comment: social    Drug use: No   Sexual activity: Not on file  Other Topics Concern   Not on file  Social History Narrative   Not on file   Environmental and Social history  Lives in a house with a dry environment,  a cat introduced inside the household and September or October 2021, carpet in the bedroom, no plastic on the bed, no plastic on the pillow, and no smoking ongoing with inside the household.  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  Objective:   Vitals:   06/12/21 1417  BP: 122/82  Pulse: 63  Resp: 16  SpO2: 98%   Height: 5\' 3"  (160 cm) Weight: 162 lb 12.8 oz (73.8 kg)  Physical Exam Constitutional:      Appearance: She is not diaphoretic.  HENT:     Head: Normocephalic.     Right Ear: Tympanic membrane, ear canal and external ear normal.     Left Ear: Tympanic membrane, ear canal and external ear normal.     Nose: Nose normal. No mucosal edema or rhinorrhea.     Mouth/Throat:     Pharynx: Uvula midline. No oropharyngeal exudate.  Eyes:     Conjunctiva/sclera: Conjunctivae normal.  Neck:     Thyroid: No thyromegaly.     Trachea: Trachea normal. No tracheal tenderness or tracheal deviation.  Cardiovascular:     Rate and Rhythm: Normal rate and regular rhythm.     Heart sounds: Normal heart sounds, S1 normal and S2 normal. No murmur heard. Pulmonary:     Effort: No respiratory distress.     Breath sounds: No stridor. Wheezing (Bilateral inspiratory and expiratory wheezes all lung fields) present. No rales.  Lymphadenopathy:     Head:     Right side of head: No tonsillar adenopathy.     Left side of head: No tonsillar adenopathy.     Cervical: No cervical adenopathy.  Skin:    Findings: No erythema or rash.     Nails: There is no clubbing.  Neurological:     Mental Status: She is alert.    Diagnostics: Allergy skin tests were performed.  She did not demonstrate any hypersensitivity against a screening panel of aeroallergens or foods.  Spirometry was performed and demonstrated an FEV1 of 1.14 @ 47 % of predicted. FEV1/FVC = 0.64.  Following administration of nebulized ipratropium and albuterol her FEV1 increased to 1.70 which was a calculated  increase of 50%.  Results of a chest x-ray obtained 01 May 2021 identified the following:  LUNGS/PLEURA: No focal opacity, pleural effusion, or pneumothorax.  HEART/MEDIASTINUM: No cardiomegaly. Mediastinal contours are preserved.  TUBES/LINES/DEVICES: None.  OSSEOUS STRUCTURES: No acute abnormality.   Results of blood tests obtained 30 March 2021 identifies WBC 3.9, hemoglobin 15.5, platelet 168, AST 46 U/L, ALT 68 U/L, alkaline phosphatase 76 U/L, bilirubin 0.6 mg/DL, creatinine 0.6 mg/DL  Assessment and Plan:    1. Not well controlled severe persistent asthma  2. Gastroesophageal reflux disease, unspecified whether esophagitis present   3. Elevated liver function tests     1.  Allergen avoidance measures?  2.  Treat and prevent inflammation:  A. Breztri - 2 inhalations 2 times a day w/spacer (empty lungs) B. Montelukast 10 mg - 1 tablet 1 time per day C. Prednisone 10 mg - 1 tablet 1 time per day x 2 weeks  3.  Treat and prevent reflux/LPR:  A. Minimize caffeine and chocolate consumption B. Omeprazole 40 mg - 1 tablet 2 times per day  4.  If needed:  A. Albuterol HFA - 2 inhalations every 4-6 hours  5.  Blood - CBC w/d, IgE, liver function panel  6.  Return to clinic in 2 weeks or earlier if problem  [name] Fermoina has very significant inflammation of her respiratory tract and we will get her to use anti-inflammatory agents on a consistent basis including the use of low-dose systemic steroids for the next 2 weeks.  It is going to take some significant amount of time to rollback her inflammation that has been present for the past 6 months or so.  The etiology of her late onset asthma is not entirely clear.  Atopic disease does not appear to be driving this issue.  If she does not respond adequately to therapy noted above she may require further evaluation for other etiologic agents responsible for respiratory tract inflammation and we need to consider giving her a biologic  agent for asthma.  She also has elevated liver function tests and we need to make sure that these have resolved and if not she will require further evaluation for that laboratory abnormality.  I will see her back in this clinic in 2 weeks.  Jessica PriestEric J. Amari Zagal, MD Allergy / Immunology [facility]

## 2021-06-12 NOTE — Patient Instructions (Addendum)
°  1.  Allergen avoidance measures?  2.  Treat and prevent inflammation:  A. Breztri - 2 inhalations 2 times a day w/spacer (empty lungs) B. Montelukast 10 mg - 1 tablet 1 time per day C. Prednisone 10 mg - 1 tablet 1 time per day x 2 weeks  3.  Treat and prevent reflux/LPR:  A. Minimize caffeine and chocolate consumption B. Omeprazole 40 mg - 1 tablet 2 times per day  4.  If needed:  A. Albuterol HFA - 2 inhalations every 4-6 hours  5.  Blood - CBC w/d, IgE, liver function panel  6.  Return to clinic in 2 weeks or earlier if problem

## 2021-06-13 ENCOUNTER — Encounter: Payer: Self-pay | Admitting: Allergy and Immunology

## 2021-06-29 ENCOUNTER — Encounter: Payer: Self-pay | Admitting: Allergy and Immunology

## 2021-06-29 ENCOUNTER — Other Ambulatory Visit: Payer: Self-pay

## 2021-06-29 ENCOUNTER — Ambulatory Visit: Payer: BC Managed Care – PPO | Admitting: Allergy and Immunology

## 2021-06-29 VITALS — BP 112/66 | HR 72 | Resp 14

## 2021-06-29 DIAGNOSIS — K219 Gastro-esophageal reflux disease without esophagitis: Secondary | ICD-10-CM

## 2021-06-29 DIAGNOSIS — J4550 Severe persistent asthma, uncomplicated: Secondary | ICD-10-CM

## 2021-06-29 DIAGNOSIS — R7989 Other specified abnormal findings of blood chemistry: Secondary | ICD-10-CM | POA: Diagnosis not present

## 2021-06-29 LAB — CBC WITH DIFFERENTIAL
Basophils Absolute: 0 10*3/uL (ref 0.0–0.2)
Basos: 0 %
EOS (ABSOLUTE): 0 10*3/uL (ref 0.0–0.4)
Eos: 0 %
Hematocrit: 45 % (ref 34.0–46.6)
Hemoglobin: 15.2 g/dL (ref 11.1–15.9)
Immature Grans (Abs): 0 10*3/uL (ref 0.0–0.1)
Immature Granulocytes: 0 %
Lymphocytes Absolute: 0.8 10*3/uL (ref 0.7–3.1)
Lymphs: 12 %
MCH: 30.9 pg (ref 26.6–33.0)
MCHC: 33.8 g/dL (ref 31.5–35.7)
MCV: 92 fL (ref 79–97)
Monocytes Absolute: 0.2 10*3/uL (ref 0.1–0.9)
Monocytes: 4 %
Neutrophils Absolute: 5.7 10*3/uL (ref 1.4–7.0)
Neutrophils: 84 %
RBC: 4.92 x10E6/uL (ref 3.77–5.28)
RDW: 12 % (ref 11.7–15.4)
WBC: 6.8 10*3/uL (ref 3.4–10.8)

## 2021-06-29 LAB — IGE: IgE (Immunoglobulin E), Serum: 38 IU/mL (ref 6–495)

## 2021-06-29 LAB — HEPATIC FUNCTION PANEL
ALT: 30 IU/L (ref 0–32)
AST: 19 IU/L (ref 0–40)
Albumin: 4.6 g/dL (ref 3.8–4.9)
Alkaline Phosphatase: 88 IU/L (ref 44–121)
Bilirubin Total: 0.3 mg/dL (ref 0.0–1.2)
Bilirubin, Direct: <0.1 mg/dL (ref 0.00–0.40)
Total Protein: 6.6 g/dL (ref 6.0–8.5)

## 2021-06-29 NOTE — Patient Instructions (Addendum)
°  1.  Treat and prevent inflammation:  A. Breztri - 2 inhalations 2 times a day w/spacer (empty lungs) B. Montelukast 10 mg - 1 tablet 1 time per day  2. Prednisone taper: Prednisone 10 mg tabs - 1/2 tab daily x 10 days, then 1/2 tablet every other day for 10 days, then stop prednisone  3.  Treat and prevent reflux/LPR:  A. Minimize caffeine and chocolate consumption B. Omeprazole 40 mg - 1 tablet 2 times per day  4.  If needed:  A. Albuterol HFA - 2 inhalations every 4-6 hours  5.  Return to clinic in 4 weeks or earlier if problem  6. Tezepelumab???

## 2021-06-29 NOTE — Progress Notes (Signed)
[facility] [place] [facility] [place]   Follow-up Note  Referring Provider: Ronita Hipps, MD Primary Provider: Ronita Hipps, MD Date of Office Visit: 06/29/2021  Subjective:   [name] [name] (DOB: 04-09-1961) is a 61 y.o. female who returns to the [facility] [place] on 06/29/2021 in re-evaluation of the following:  HPI: [name] [name] presents to this clinic in evaluation of severe uncontrolled asthma, reflux, and a history of elevated liver function test.  I last saw her in this clinic during initial evaluation 12 June 2021.  She is much better at this point in time and has resolved the bulk of her respiratory tract symptoms tied up with asthma and does not need to use a short acting bronchodilator.  She has no wheezing and she has no coughing.  She has been consistently using anti-inflammatory medications including her prednisone and currently at a dose of 10 mg daily.  She still has a lot of throat clearing and some mucus in her throat but she is not having much heartburn.  She is only using omeprazole 20 mg daily.  Allergies as of 06/29/2021   No Known Allergies      Medication List    albuterol 108 (90 Base) MCG/ACT inhaler Commonly known as: VENTOLIN HFA 2 puffs every 4-6 hours as needed   [employer] Digihaler 232 MCG/ACT Aepb Generic drug: Fluticasone Propionate(sensor) Inhale 1 puff into the lungs 2 (two) times daily.   Breztri Aerosphere 160-9-4.8 MCG/ACT Aero Generic drug: Budeson-Glycopyrrol-Formoterol 2 inhalations 2 times a day with spacer.   montelukast 10 MG tablet Commonly known as: SINGULAIR Take 1 tablet (10 mg total) by mouth at bedtime.   omeprazole 40 MG capsule Commonly known as: PRILOSEC Take 1 capsule 2 times per day.   valsartan-hydrochlorothiazide 80-12.5 MG tablet Commonly known as: DIOVAN-HCT Take 1 tablet by mouth daily.    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   HENT: Negative.    Eyes: Negative.   Respiratory: Negative.    Cardiovascular: Negative.   Gastrointestinal: Negative.   Genitourinary: Negative.   Musculoskeletal: Negative.   Skin: Negative.   Neurological: Negative.   Endo/Heme/Allergies: Negative.   Psychiatric/Behavioral: Negative.      Objective:   Vitals:   06/29/21 1551  BP: 112/66  Pulse: 72  Resp: 14  SpO2: 98%          Physical Exam Constitutional:      Appearance: She is not diaphoretic.  HENT:     Head: Normocephalic.     Right Ear: Tympanic membrane, ear canal and external ear normal.     Left Ear: Tympanic membrane, ear canal and external ear normal.     Nose: Nose normal. No mucosal edema or rhinorrhea.     Mouth/Throat:     Pharynx: Uvula midline. No oropharyngeal exudate.  Eyes:     Conjunctiva/sclera: Conjunctivae  normal.  Neck:     Thyroid: No thyromegaly.     Trachea: Trachea normal. No tracheal tenderness or tracheal deviation.  Cardiovascular:     Rate and Rhythm: Normal rate and regular rhythm.     Heart sounds: Normal heart sounds, S1 normal and S2 normal. No murmur heard. Pulmonary:     Effort: No respiratory distress.     Breath sounds: Normal breath sounds. No stridor. No wheezing or rales.  Lymphadenopathy:     Head:     Right side of head: No tonsillar adenopathy.     Left side of head: No tonsillar adenopathy.     Cervical: No cervical adenopathy.  Skin:    Findings: No erythema or rash.      Nails: There is no clubbing.  Neurological:     Mental Status: She is alert.    Diagnostics:    Spirometry was performed and demonstrated an FEV1 of 1.79 at 73  % of predicted.  Results of blood tests obtained 23 June 2021 identifies AST 19 U/L, ALT 30 U/L, WBC 6.8, absolute eosinophils 0, absolute lymphocyte 800, hemoglobin 15.2, IgE 38 KU/L  Assessment and Plan:   1. Not well controlled severe persistent asthma   2. Gastroesophageal reflux disease, unspecified whether esophagitis present   3. Elevated liver function tests     1.  Treat and prevent inflammation:  A. Breztri - 2 inhalations 2 times a day w/spacer (empty lungs) B. Montelukast 10 mg - 1 tablet 1 time per day  2. Prednisone taper: Prednisone 10 mg tabs - 1/2 tab daily x 10 days, then 1/2 tablet every other day for 10 days, then stop prednisone  3.  Treat and prevent reflux/LPR:  A. Minimize caffeine and chocolate consumption B. Omeprazole 40 mg - 1 tablet 2 times per day  4.  If needed:  A. Albuterol HFA - 2 inhalations every 4-6 hours  5.  Return to clinic in 4 weeks or earlier if problem  6. Tezepelumab???  [name] [name] appears to be doing much better at this point in time.  We will now taper her off systemic steroid use with the plan noted above while she continues on a triple inhaler and a leukotriene modifier and continues to address her issue with reflux.  If she fails this approach then we will start her on anti-TSLP antibody.  She does not qualify for anti-IL-5 or anti-IL-4/13 biologic agent or omalizumab.  Fortunately, her liver function test have returned to normal and we will not have her undergo any further evaluation for this issue at this point.  Allena Katz, MD Allergy / [facility] [place]

## 2021-07-03 ENCOUNTER — Encounter: Payer: Self-pay | Admitting: Allergy and Immunology

## 2021-07-03 NOTE — Addendum Note (Signed)
Addended by: Deborra Medina on: 07/03/2021 04:05 PM   Modules accepted: Orders

## 2021-07-07 ENCOUNTER — Telehealth: Payer: Self-pay | Admitting: *Deleted

## 2021-07-07 NOTE — Telephone Encounter (Signed)
Letter to home for lab results.  °

## 2021-07-18 ENCOUNTER — Ambulatory Visit: Payer: Self-pay | Admitting: Allergy

## 2021-07-31 ENCOUNTER — Ambulatory Visit: Payer: BC Managed Care – PPO | Admitting: Allergy and Immunology

## 2022-11-12 ENCOUNTER — Other Ambulatory Visit: Payer: Self-pay | Admitting: Podiatry

## 2022-11-12 ENCOUNTER — Encounter: Payer: Self-pay | Admitting: Podiatry

## 2022-11-12 ENCOUNTER — Ambulatory Visit (INDEPENDENT_AMBULATORY_CARE_PROVIDER_SITE_OTHER): Payer: BC Managed Care – PPO

## 2022-11-12 ENCOUNTER — Ambulatory Visit: Payer: BC Managed Care – PPO | Admitting: Podiatry

## 2022-11-12 DIAGNOSIS — M7752 Other enthesopathy of left foot: Secondary | ICD-10-CM

## 2022-11-12 DIAGNOSIS — M25872 Other specified joint disorders, left ankle and foot: Secondary | ICD-10-CM

## 2022-11-12 DIAGNOSIS — M79675 Pain in left toe(s): Secondary | ICD-10-CM

## 2022-11-12 MED ORDER — TRIAMCINOLONE ACETONIDE 10 MG/ML IJ SUSP
10.0000 mg | Freq: Once | INTRAMUSCULAR | Status: AC
  Administered 2022-11-12: 10 mg

## 2022-11-13 NOTE — Progress Notes (Signed)
Subjective:   Patient ID: Barkley Boards, female   DOB: 62 y.o.   MRN: [id number]   HPI Patient presents stating she has had a lot of pain in her left forefoot and does not remember specific injury.  States it has been hurting her for a while got worse recently and it is worse around the big toe joint plantarly.  Patient does not smoke likes to be active   Review of Systems  All other systems reviewed and are negative.       Objective:  Physical Exam Vitals and nursing note reviewed.  Constitutional:      Appearance: She is well-developed.  Pulmonary:     Effort: Pulmonary effort is normal.  Musculoskeletal:        General: Normal range of motion.  Skin:    General: Skin is warm.  Neurological:     Mental Status: She is alert.     Neurovascular status intact muscle strength adequate range of motion within normal limits with patient found to have inflammation pain mostly around the fibular sesamoid and plantar first metatarsal with moderate discomfort also around the second MPJ left.  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

## 2023-01-08 ENCOUNTER — Other Ambulatory Visit: Payer: Self-pay | Admitting: Allergy and Immunology
